# Patient Record
Sex: Female | Born: 1990 | Race: Black or African American | Hispanic: No | Marital: Single | Smoking: Never smoker
Health system: Southern US, Community
[De-identification: ages and names within clinical notes are randomized; demographics above are authoritative.]

## PROBLEM LIST (undated history)

## (undated) DIAGNOSIS — R7303 Prediabetes: Secondary | ICD-10-CM

## (undated) DIAGNOSIS — E282 Polycystic ovarian syndrome: Secondary | ICD-10-CM

---

## 2019-12-30 ENCOUNTER — Emergency Department (HOSPITAL_COMMUNITY)
Admission: EM | Admit: 2019-12-30 | Discharge: 2019-12-30 | Disposition: A | Discharge status: Home / Self Care | Payer: Self-pay | Attending: Emergency Medicine | Admitting: Emergency Medicine

## 2019-12-30 ENCOUNTER — Emergency Department (HOSPITAL_COMMUNITY): Payer: Self-pay

## 2019-12-30 ENCOUNTER — Other Ambulatory Visit: Payer: Self-pay

## 2019-12-30 ENCOUNTER — Encounter (HOSPITAL_COMMUNITY): Payer: Self-pay

## 2019-12-30 DIAGNOSIS — R030 Elevated blood-pressure reading, without diagnosis of hypertension: Secondary | ICD-10-CM | POA: Insufficient documentation

## 2019-12-30 DIAGNOSIS — Y999 Unspecified external cause status: Secondary | ICD-10-CM | POA: Insufficient documentation

## 2019-12-30 DIAGNOSIS — Y929 Unspecified place or not applicable: Secondary | ICD-10-CM | POA: Insufficient documentation

## 2019-12-30 DIAGNOSIS — X509XXA Other and unspecified overexertion or strenuous movements or postures, initial encounter: Secondary | ICD-10-CM | POA: Insufficient documentation

## 2019-12-30 DIAGNOSIS — S29011A Strain of muscle and tendon of front wall of thorax, initial encounter: Secondary | ICD-10-CM | POA: Insufficient documentation

## 2019-12-30 DIAGNOSIS — Y939 Activity, unspecified: Secondary | ICD-10-CM | POA: Insufficient documentation

## 2019-12-30 LAB — BASIC METABOLIC PANEL
Anion gap: 9 (ref 5–15)
BUN: 11 mg/dL (ref 6–20)
CO2: 29 mmol/L (ref 22–32)
Calcium: 9.3 mg/dL (ref 8.9–10.3)
Chloride: 101 mmol/L (ref 98–111)
Creatinine, Ser: 0.62 mg/dL (ref 0.44–1.00)
GFR calc Af Amer: >60 mL/min (ref >60)
GFR calc non Af Amer: >60 mL/min (ref >60)
Glucose, Bld: 130 mg/dL — ABNORMAL HIGH (ref 70–99)
Potassium: 4 mmol/L (ref 3.5–5.1)
Sodium: 139 mmol/L (ref 135–145)

## 2019-12-30 LAB — CBC
HCT: 46 % (ref 36.0–46.0)
Hemoglobin: 14.2 g/dL (ref 12.0–15.0)
MCH: 26 pg (ref 26.0–34.0)
MCHC: 30.9 g/dL (ref 30.0–36.0)
MCV: 84.2 fL (ref 80.0–100.0)
Platelets: 360 10*3/uL (ref 150–400)
RBC: 5.46 MIL/uL — ABNORMAL HIGH (ref 3.87–5.11)
RDW: 14.7 % (ref 11.5–15.5)
WBC: 7.7 10*3/uL (ref 4.0–10.5)
nRBC: 0 % (ref 0.0–0.2)

## 2019-12-30 LAB — TROPONIN I (HIGH SENSITIVITY): Troponin I (High Sensitivity): <2 ng/L (ref <18)

## 2019-12-30 LAB — I-STAT BETA HCG BLOOD, ED (MC, WL, AP ONLY): I-stat hCG, quantitative: <5 m[IU]/mL (ref <5)

## 2019-12-30 MED ORDER — ACETAMINOPHEN 325 MG PO TABS
650.0000 mg | ORAL_TABLET | Freq: Once | ORAL | Status: AC
  Administered 2019-12-30: 11:00:00 650 mg via ORAL
  Filled 2019-12-30: qty 2

## 2019-12-30 MED ORDER — SODIUM CHLORIDE 0.9% FLUSH: 3.0000 mL | Freq: Once | INTRAVENOUS | Status: DC

## 2019-12-30 NOTE — ED Triage Notes (Addendum)
Patient states she was having chest pain this AM at 0600 and when she picked her kids up to hold the pain was worse and had pain again when she twisted and turned.  Patient added during triage that she is having left chest pain when she takes a deep breath.

## 2019-12-30 NOTE — ED Provider Notes (Signed)
Kelsey Lucas   CSN: [id number] Arrival date & time: 12/30/19  0931     History Chief Complaint  Patient presents with  . Chest Pain    Kelsey Lucas is a 29 y.o. female.  The history is provided by the patient and medical records. No language interpreter was used.  Chest Pain  Kelsey Lucas is a 29 y.o. female who presents to the Emergency Department complaining of chest pain. She presents to the emergency department complaining of left sided chest pain that began this morning. She works at a daycare and noticed that she has pain on bending, twisting and lifting. She also has pain with deep breaths. She denies any fevers, cough, shortness of breath, leg swelling or pain, nausea, vomiting, abdominal pain. She has no significant past medical history and takes no medications. Symptoms are mild to moderate in nature.    History reviewed. No pertinent past medical history.  There are no problems to display for this patient.   History reviewed. No pertinent surgical history.   OB History   No obstetric history on file.     Family History  Problem Relation Age of Onset  . Hypertension Mother   . Diabetes Mother   . Cancer Father     Social History   Tobacco Use  . Smoking status: Never Smoker  . Smokeless tobacco: Never Used  Substance Use Topics  . Alcohol use: Never  . Drug use: Never    Home Medications Prior to Admission medications   Not on File    Allergies    Ibuprofen  Review of Systems   Review of Systems  Cardiovascular: Positive for chest pain.  All other systems reviewed and are negative.   Physical Exam Updated Vital Signs BP (!) 154/102 (BP Location: Left Arm)   Pulse 89   Temp 98.2 F (36.8 C) (Oral)   Resp 16   Ht 5\' 4"  (1.626 m)   Wt (!) 147.4 kg   SpO2 100%   BMI 55.79 kg/m   Physical Exam Vitals and nursing Lucas reviewed.  Constitutional:      Appearance: She is  well-developed.  HENT:     Head: Normocephalic and atraumatic.  Cardiovascular:     Rate and Rhythm: Normal rate and regular rhythm.     Heart sounds: No murmur.  Pulmonary:     Effort: Pulmonary effort is normal. No respiratory distress.     Breath sounds: Normal breath sounds.     Comments: There is no chest wall tenderness to palpation but pain is reproducible on resistance testing of the left upper extremity. Chest:     Chest wall: No tenderness.  Abdominal:     Palpations: Abdomen is soft.     Tenderness: There is no abdominal tenderness. There is no guarding or rebound.  Musculoskeletal:        General: No tenderness.     Comments: 2+ radial pulses bilaterally  Skin:    General: Skin is warm and dry.  Neurological:     Mental Status: She is alert and oriented to person, place, and time.  Psychiatric:        Behavior: Behavior normal.     ED Results / Procedures / Treatments   Labs (all labs ordered are listed, but only abnormal results are displayed) Labs Reviewed  BASIC METABOLIC PANEL - Abnormal; Notable for the following components:      Result Value   Glucose, Bld 130 (*)  All other components within normal limits  CBC - Abnormal; Notable for the following components:   RBC 5.46 (*)    All other components within normal limits  I-STAT BETA HCG BLOOD, ED ([facility], [facility], [facility] ONLY)  TROPONIN I (HIGH SENSITIVITY)  TROPONIN I (HIGH SENSITIVITY)    EKG EKG Interpretation  Date/Time:  Monday December 30 2019 09:42:31 EST Ventricular Rate:  95 PR Interval:    QRS Duration: 77 QT Interval:  334 QTC Calculation: 420 R Axis:   61 Text Interpretation: Sinus rhythm no prior available for comparison Confirmed by Tilden Fossa [phone]) on 12/30/2019 9:49:33 AM Also confirmed by Tilden Fossa [phone]), editor Elita Quick [phone])  on 12/30/2019 10:36:46 AM   Radiology DG Chest 2 View  Result Date: 12/30/2019 CLINICAL DATA:  Chest pain EXAM: CHEST - 2 VIEW  COMPARISON:  None. FINDINGS: Normal heart size and mediastinal contours. No acute infiltrate or edema. No effusion or pneumothorax. No acute osseous findings. IMPRESSION: Negative chest. Electronically Signed   By: Marnee Spring M.D.   On: 12/30/2019 10:59    Procedures Procedures (including critical care time)  Medications Ordered in ED Medications  acetaminophen (TYLENOL) tablet 650 mg (has no administration in time range)    ED Course  I have reviewed the triage vital signs and the nursing notes.  Pertinent labs & imaging results that were available during my care of the patient were reviewed by me and considered in my medical decision making (see chart for details).    MDM Rules/Calculators/A&P                     Patient here for evaluation of left-sided chest pain, reproducible on examination. Presentation is not consistent with ACS, PE, dissection, pneumonia. Discussed with patient home care for chest wall strain. Discussed outpatient follow-up and return precautions.  Kelsey Lucas was evaluated in Emergency Department on 12/30/2019 for the symptoms described in the history of present illness. She was evaluated in the context of the global COVID-19 pandemic, which necessitated consideration that the patient might be at risk for infection with the SARS-CoV-2 virus that causes COVID-19. Institutional protocols and algorithms that pertain to the evaluation of patients at risk for COVID-19 are in a state of rapid change based on information released by regulatory bodies including the CDC and federal and state organizations. These policies and algorithms were followed during the patient's care in the ED.  Final Clinical Impression(s) / ED Diagnoses Final diagnoses:  None    Rx / DC Orders ED Discharge Orders    None       Tilden Fossa, MD 12/30/19 1122

## 2019-12-30 NOTE — ED Notes (Signed)
Repeat trop to be redrawn at [facility]

## 2019-12-30 NOTE — Discharge Instructions (Signed)
You can take tylenol as well as lidoderm cream or voltaren gel, available over the counter according to label instructions.    Your blood pressure was elevated today. Please follow-up with your family doctor for recheck.

## 2020-03-23 ENCOUNTER — Encounter (HOSPITAL_COMMUNITY): Payer: Self-pay | Admitting: Emergency Medicine

## 2020-03-23 ENCOUNTER — Emergency Department (HOSPITAL_COMMUNITY)
Admission: EM | Admit: 2020-03-23 | Discharge: 2020-03-23 | Disposition: A | Discharge status: Home / Self Care | Payer: Self-pay | Attending: Emergency Medicine | Admitting: Emergency Medicine

## 2020-03-23 ENCOUNTER — Other Ambulatory Visit: Payer: Self-pay

## 2020-03-23 DIAGNOSIS — R1033 Periumbilical pain: Secondary | ICD-10-CM | POA: Insufficient documentation

## 2020-03-23 DIAGNOSIS — R197 Diarrhea, unspecified: Secondary | ICD-10-CM | POA: Insufficient documentation

## 2020-03-23 LAB — CBC WITH DIFFERENTIAL/PLATELET
Abs Immature Granulocytes: 0.02 10*3/uL (ref 0.00–0.07)
Basophils Absolute: 0 10*3/uL (ref 0.0–0.1)
Basophils Relative: 1 %
Eosinophils Absolute: 0.1 10*3/uL (ref 0.0–0.5)
Eosinophils Relative: 1 %
HCT: 46.9 % — ABNORMAL HIGH (ref 36.0–46.0)
Hemoglobin: 14.5 g/dL (ref 12.0–15.0)
Immature Granulocytes: 0 %
Lymphocytes Relative: 29 %
Lymphs Abs: 1.5 10*3/uL (ref 0.7–4.0)
MCH: 26 pg (ref 26.0–34.0)
MCHC: 30.9 g/dL (ref 30.0–36.0)
MCV: 84.1 fL (ref 80.0–100.0)
Monocytes Absolute: 0.7 10*3/uL (ref 0.1–1.0)
Monocytes Relative: 13 %
Neutro Abs: 2.9 10*3/uL (ref 1.7–7.7)
Neutrophils Relative %: 56 %
Platelets: 349 10*3/uL (ref 150–400)
RBC: 5.58 MIL/uL — ABNORMAL HIGH (ref 3.87–5.11)
RDW: 14.4 % (ref 11.5–15.5)
WBC: 5.2 10*3/uL (ref 4.0–10.5)
nRBC: 0 % (ref 0.0–0.2)

## 2020-03-23 LAB — COMPREHENSIVE METABOLIC PANEL
ALT: 40 U/L (ref 0–44)
AST: 28 U/L (ref 15–41)
Albumin: 3.9 g/dL (ref 3.5–5.0)
Alkaline Phosphatase: 92 U/L (ref 38–126)
Anion gap: 10 (ref 5–15)
BUN: 11 mg/dL (ref 6–20)
CO2: 26 mmol/L (ref 22–32)
Calcium: 8.5 mg/dL — ABNORMAL LOW (ref 8.9–10.3)
Chloride: 102 mmol/L (ref 98–111)
Creatinine, Ser: 0.71 mg/dL (ref 0.44–1.00)
GFR calc Af Amer: >60 mL/min (ref >60)
GFR calc non Af Amer: >60 mL/min (ref >60)
Glucose, Bld: 119 mg/dL — ABNORMAL HIGH (ref 70–99)
Potassium: 4 mmol/L (ref 3.5–5.1)
Sodium: 138 mmol/L (ref 135–145)
Total Bilirubin: 0.9 mg/dL (ref 0.3–1.2)
Total Protein: 8.2 g/dL — ABNORMAL HIGH (ref 6.5–8.1)

## 2020-03-23 LAB — URINALYSIS, ROUTINE W REFLEX MICROSCOPIC
Bilirubin Urine: NEGATIVE
Glucose, UA: NEGATIVE mg/dL
Hgb urine dipstick: NEGATIVE
Ketones, ur: NEGATIVE mg/dL
Nitrite: NEGATIVE
Protein, ur: 30 mg/dL — AB
Specific Gravity, Urine: 1.027 (ref 1.005–1.030)
pH: 5 (ref 5.0–8.0)

## 2020-03-23 LAB — I-STAT BETA HCG BLOOD, ED (MC, WL, AP ONLY): I-stat hCG, quantitative: <5 m[IU]/mL (ref <5)

## 2020-03-23 LAB — LIPASE, BLOOD: Lipase: 26 U/L (ref 11–51)

## 2020-03-23 MED ORDER — ONDANSETRON 4 MG PO TBDP: ORAL_TABLET | ORAL | 0 refills | Status: AC

## 2020-03-23 MED ORDER — FAMOTIDINE 20 MG PO TABS: 20.0000 mg | ORAL_TABLET | Freq: Two times a day (BID) | ORAL | 0 refills | Status: DC

## 2020-03-23 MED ORDER — SODIUM CHLORIDE 0.9 % IV BOLUS
1000.0000 mL | Freq: Once | INTRAVENOUS | Status: AC
  Administered 2020-03-23: 1000 mL via INTRAVENOUS

## 2020-03-23 MED ORDER — DICYCLOMINE HCL 10 MG/ML IM SOLN
20.0000 mg | Freq: Once | INTRAMUSCULAR | Status: AC
  Administered 2020-03-23: 20 mg via INTRAMUSCULAR
  Filled 2020-03-23: qty 2

## 2020-03-23 MED ORDER — DICYCLOMINE HCL 20 MG PO TABS: 20.0000 mg | ORAL_TABLET | Freq: Two times a day (BID) | ORAL | 0 refills | Status: DC

## 2020-03-23 MED ORDER — FAMOTIDINE IN NACL 20-0.9 MG/50ML-% IV SOLN
20.0000 mg | Freq: Once | INTRAVENOUS | Status: AC
  Administered 2020-03-23: 20 mg via INTRAVENOUS
  Filled 2020-03-23: qty 50

## 2020-03-23 MED ORDER — ONDANSETRON HCL 4 MG/2ML IJ SOLN
4.0000 mg | Freq: Once | INTRAMUSCULAR | Status: AC
  Administered 2020-03-23: 12:00:00 4 mg via INTRAVENOUS
  Filled 2020-03-23: qty 2

## 2020-03-23 NOTE — ED Triage Notes (Signed)
Pt c/o mid abd pains that is constant since yesterday. Reports when she eats or drinks anything has diarrhea. Denies n/v.

## 2020-03-23 NOTE — ED Notes (Signed)
ED Provider at bedside. 

## 2020-03-23 NOTE — ED Provider Notes (Signed)
[facility]-EMERGENCY DEPT Provider Note   CSN: [id number] Arrival date & time: 03/23/20  1105     History Chief Complaint  Patient presents with  . Abdominal Pain  . Diarrhea    Kelsey Lucas is a 29 y.o. female.  Kelsey Lucas is a 29 y.o. female with history of obesity, otherwise healthy, who presents to the ED for evaluation of periumbilical abdominal pains associated with diarrhea.  Patient states that ever since yesterday afternoon whenever she eats or drinks something she has diarrhea shortly after.  She has not noticed any blood in her stool.  She reports an associated cramping abdominal pain in the middle of her abdomen that is constant.  She denies any fevers or chills.  No nausea or vomiting.  No dysuria or urinary frequency.  Did not eat anything out of the ordinary yesterday.  No history of abdominal surgeries.  Has not taken anything to treat her symptoms prior to arrival.  No other aggravating or alleviating factors.        History reviewed. No pertinent past medical history.  There are no problems to display for this patient.   History reviewed. No pertinent surgical history.   OB History   No obstetric history on file.     Family History  Problem Relation Age of Onset  . Hypertension Mother   . Diabetes Mother   . Cancer Father     Social History   Tobacco Use  . Smoking status: Never Smoker  . Smokeless tobacco: Never Used  Substance Use Topics  . Alcohol use: Never  . Drug use: Never    Home Medications Prior to Admission medications   Not on File    Allergies    Ibuprofen  Review of Systems   Review of Systems  Constitutional: Negative for chills and fever.  HENT: Negative.   Respiratory: Negative for cough and shortness of breath.   Cardiovascular: Negative for chest pain.  Gastrointestinal: Positive for abdominal pain and diarrhea. Negative for blood in stool, constipation, nausea and vomiting.    Genitourinary: Negative for dysuria and frequency.  Musculoskeletal: Negative for arthralgias and myalgias.  Skin: Negative for color change and rash.  Neurological: Negative for dizziness, syncope and light-headedness.    Physical Exam Updated Vital Signs BP (!) 152/119 (BP Location: Left Arm)   Pulse (!) 102   Temp 97.9 F (36.6 C) (Oral)   Resp 18   SpO2 99%   Physical Exam Vitals and nursing note reviewed.  Constitutional:      General: She is not in acute distress.    Appearance: She is well-developed. She is obese. She is not ill-appearing or diaphoretic.  HENT:     Head: Normocephalic and atraumatic.  Eyes:     General:        Right eye: No discharge.        Left eye: No discharge.     Pupils: Pupils are equal, round, and reactive to light.  Cardiovascular:     Rate and Rhythm: Normal rate and regular rhythm.     Heart sounds: Normal heart sounds. No murmur. No friction rub. No gallop.   Pulmonary:     Effort: Pulmonary effort is normal. No respiratory distress.     Breath sounds: Normal breath sounds. No wheezing or rales.     Comments: Respirations equal and unlabored, patient able to speak in full sentences, lungs clear to auscultation bilaterally Abdominal:     General: Bowel sounds are  normal. There is no distension.     Palpations: Abdomen is soft. There is no mass.     Tenderness: There is abdominal tenderness. There is no guarding.     Comments: Abdomen is soft, nondistended, bowel sounds present throughout, patient has some periumbilical abdominal tenderness on exam without guarding or peritoneal signs, all other quadrants nontender to palpation  Musculoskeletal:        General: No deformity.     Cervical back: Neck supple.  Skin:    General: Skin is warm and dry.     Capillary Refill: Capillary refill takes less than 2 seconds.  Neurological:     Mental Status: She is alert.     Coordination: Coordination normal.     Comments: Speech is clear, able  to follow commands Moves extremities without ataxia, coordination intact  Psychiatric:        Mood and Affect: Mood normal.        Behavior: Behavior normal.     ED Results / Procedures / Treatments   Labs (all labs ordered are listed, but only abnormal results are displayed) Labs Reviewed  COMPREHENSIVE METABOLIC PANEL - Abnormal; Notable for the following components:      Result Value   Glucose, Bld 119 (*)    Calcium 8.5 (*)    Total Protein 8.2 (*)    All other components within normal limits  CBC WITH DIFFERENTIAL/PLATELET - Abnormal; Notable for the following components:   RBC 5.58 (*)    HCT 46.9 (*)    All other components within normal limits  URINALYSIS, ROUTINE W REFLEX MICROSCOPIC - Abnormal; Notable for the following components:   APPearance CLOUDY (*)    Protein, ur 30 (*)    Leukocytes,Ua MODERATE (*)    Bacteria, UA FEW (*)    All other components within normal limits  LIPASE, BLOOD  I-STAT BETA HCG BLOOD, ED ([facility], [facility], [facility] ONLY)    EKG None  Radiology No results found.  Procedures Procedures (including critical care time)  Medications Ordered in ED Medications  sodium chloride 0.9 % bolus 1,000 mL (1,000 mLs Intravenous New Bag/Given (Non-Interop) 03/23/20 1209)  ondansetron (ZOFRAN) injection 4 mg (4 mg Intravenous Given 03/23/20 1210)  dicyclomine (BENTYL) injection 20 mg (20 mg Intramuscular Given 03/23/20 1210)  famotidine (PEPCID) IVPB 20 mg premix (20 mg Intravenous New Bag/Given (Non-Interop) 03/23/20 1210)    ED Course  I have reviewed the triage vital signs and the nursing notes.  Pertinent labs & imaging results that were available during my care of the patient were reviewed by me and considered in my medical decision making (see chart for details).    MDM Rules/Calculators/A&P                     Patient presents to the ED with complaints of abdominal pain. Patient nontoxic appearing, in no apparent distress, mild tachycardia but vitals  otherwise normal. On exam patient tender to palpation in the periumbilical region, no peritoneal signs. Will evaluate with labs. Analgesics, anti-emetics, and fluids administered.  Mild pain associated with diarrhea, suspect mild enteritis or colitis, given reassuring exam do not feel imaging is indicated at this time.  ER work-up reviewed:  CBC: No leukocytosis, normal hemoglobin CMP: Glucose of 119 no other significant electrolyte derangements, normal renal and liver function Lipase: WNL UA: Moderate leukocytes with few bacteria and white cells, but 6-10 squamous cells also noted, I suspect contamination, as patient is not having any urinary  symptoms Preg test: Negative  On repeat abdominal exam patient remains without peritoneal signs, doubt cholecystitis, pancreatitis, diverticulitis, appendicitis, bowel obstruction/perforation, PID, or ectopic pregnancy. Patient tolerating PO in the emergency department. Will discharge home with supportive measures. I discussed results, treatment plan, need for PCP follow-up, and return precautions with the patient. Provided opportunity for questions, patient confirmed understanding and is in agreement with plan.   BP (!) 144/104   Pulse 88   Temp 97.9 F (36.6 C) (Oral)   Resp 16   SpO2 98%    Final Clinical Impression(s) / ED Diagnoses Final diagnoses:  Periumbilical abdominal pain  Diarrhea, unspecified type    Rx / DC Orders ED Discharge Orders         Ordered    dicyclomine (BENTYL) 20 MG tablet  2 times daily     03/23/20 1314    ondansetron (ZOFRAN ODT) 4 MG disintegrating tablet     03/23/20 1314    famotidine (PEPCID) 20 MG tablet  2 times daily     03/23/20 1317           Benedetto Goad [place], [place] 03/23/20 1327    Sherwood Gambler, MD 03/24/20 [phone]

## 2020-03-23 NOTE — Discharge Instructions (Signed)
Your work-up today is reassuring, I suspect this may be related to food or viral syndrome that is caused some inflammation in your colon or intestines.  Take Bentyl to help with cramping pain, you can use Zofran as needed for any nausea or vomiting, Pepcid to help with any stomach irritation.  To help relieve diarrhea you can also take a daily probiotic (brands like align or Culturelle are good) these can be purchased over-the-counter.  You should also eat a bland diet for the next few days, read information provided on this.  Follow-up with your primary care doctor, return to the ED if you have worsening abdominal pain or pain that moves to a new location.  Worsening diarrhea or blood in your stool, vomiting, fevers or any other new or concerning symptoms.

## 2020-04-06 ENCOUNTER — Emergency Department (HOSPITAL_COMMUNITY): Payer: No Typology Code available for payment source

## 2020-04-06 ENCOUNTER — Other Ambulatory Visit: Payer: Self-pay

## 2020-04-06 ENCOUNTER — Emergency Department (HOSPITAL_COMMUNITY)
Admission: EM | Admit: 2020-04-06 | Discharge: 2020-04-06 | Disposition: A | Discharge status: Home / Self Care | Payer: No Typology Code available for payment source | Attending: Emergency Medicine | Admitting: Emergency Medicine

## 2020-04-06 ENCOUNTER — Encounter (HOSPITAL_COMMUNITY): Payer: Self-pay

## 2020-04-06 DIAGNOSIS — Z79899 Other long term (current) drug therapy: Secondary | ICD-10-CM | POA: Diagnosis not present

## 2020-04-06 DIAGNOSIS — R112 Nausea with vomiting, unspecified: Secondary | ICD-10-CM

## 2020-04-06 DIAGNOSIS — R1011 Right upper quadrant pain: Secondary | ICD-10-CM

## 2020-04-06 DIAGNOSIS — K8020 Calculus of gallbladder without cholecystitis without obstruction: Secondary | ICD-10-CM | POA: Diagnosis not present

## 2020-04-06 DIAGNOSIS — R109 Unspecified abdominal pain: Secondary | ICD-10-CM | POA: Diagnosis present

## 2020-04-06 LAB — CBC
HCT: 46.6 % — ABNORMAL HIGH (ref 36.0–46.0)
Hemoglobin: 14.6 g/dL (ref 12.0–15.0)
MCH: 26.3 pg (ref 26.0–34.0)
MCHC: 31.3 g/dL (ref 30.0–36.0)
MCV: 83.8 fL (ref 80.0–100.0)
Platelets: 397 10*3/uL (ref 150–400)
RBC: 5.56 MIL/uL — ABNORMAL HIGH (ref 3.87–5.11)
RDW: 14.6 % (ref 11.5–15.5)
WBC: 8.6 10*3/uL (ref 4.0–10.5)
nRBC: 0 % (ref 0.0–0.2)

## 2020-04-06 LAB — BASIC METABOLIC PANEL
Anion gap: 11 (ref 5–15)
BUN: 14 mg/dL (ref 6–20)
CO2: 27 mmol/L (ref 22–32)
Calcium: 9.6 mg/dL (ref 8.9–10.3)
Chloride: 101 mmol/L (ref 98–111)
Creatinine, Ser: 0.72 mg/dL (ref 0.44–1.00)
GFR calc Af Amer: >60 mL/min (ref >60)
GFR calc non Af Amer: >60 mL/min (ref >60)
Glucose, Bld: 159 mg/dL — ABNORMAL HIGH (ref 70–99)
Potassium: 3.7 mmol/L (ref 3.5–5.1)
Sodium: 139 mmol/L (ref 135–145)

## 2020-04-06 LAB — URINALYSIS, ROUTINE W REFLEX MICROSCOPIC
Bilirubin Urine: NEGATIVE
Glucose, UA: NEGATIVE mg/dL
Ketones, ur: NEGATIVE mg/dL
Nitrite: NEGATIVE
Protein, ur: NEGATIVE mg/dL
RBC / HPF: >50 RBC/hpf — ABNORMAL HIGH (ref 0–5)
Specific Gravity, Urine: 1.012 (ref 1.005–1.030)
pH: 5 (ref 5.0–8.0)

## 2020-04-06 LAB — HEPATIC FUNCTION PANEL
ALT: 50 U/L — ABNORMAL HIGH (ref 0–44)
AST: 30 U/L (ref 15–41)
Albumin: 4.2 g/dL (ref 3.5–5.0)
Alkaline Phosphatase: 86 U/L (ref 38–126)
Bilirubin, Direct: 0.1 mg/dL (ref 0.0–0.2)
Indirect Bilirubin: 0.6 mg/dL (ref 0.3–0.9)
Total Bilirubin: 0.7 mg/dL (ref 0.3–1.2)
Total Protein: 8.5 g/dL — ABNORMAL HIGH (ref 6.5–8.1)

## 2020-04-06 LAB — LIPASE, BLOOD: Lipase: 30 U/L (ref 11–51)

## 2020-04-06 LAB — I-STAT BETA HCG BLOOD, ED (MC, WL, AP ONLY): I-stat hCG, quantitative: <5 m[IU]/mL (ref <5)

## 2020-04-06 MED ORDER — ONDANSETRON 4 MG PO TBDP
4.0000 mg | ORAL_TABLET | Freq: Once | ORAL | Status: AC
  Administered 2020-04-06: 05:00:00 4 mg via ORAL
  Filled 2020-04-06: qty 1

## 2020-04-06 MED ORDER — MORPHINE SULFATE (PF) 4 MG/ML IV SOLN
4.0000 mg | Freq: Once | INTRAVENOUS | Status: AC
  Administered 2020-04-06: 4 mg via INTRAVENOUS
  Filled 2020-04-06: qty 1

## 2020-04-06 MED ORDER — ONDANSETRON HCL 4 MG/2ML IJ SOLN
4.0000 mg | Freq: Once | INTRAMUSCULAR | Status: AC
  Administered 2020-04-06: 4 mg via INTRAVENOUS
  Filled 2020-04-06: qty 2

## 2020-04-06 MED ORDER — ONDANSETRON 4 MG PO TBDP: 4.0000 mg | ORAL_TABLET | Freq: Three times a day (TID) | ORAL | 0 refills | Status: DC | PRN

## 2020-04-06 MED ORDER — HYDROCODONE-ACETAMINOPHEN 5-325 MG PO TABS: 1.0000 | ORAL_TABLET | Freq: Four times a day (QID) | ORAL | 0 refills | Status: DC | PRN

## 2020-04-06 NOTE — ED Provider Notes (Signed)
Care assumed from [facility], [place], at shift change, please see their notes for full documentation of patient's complaint/HPI. Briefly, pt here with complaint of right sided flank pain with nausea. Results so far show no leukocytosis and no electrolyte abnormalities. Awaiting CT Renal stone study and U/A. Plan is to reassess and dispo accordingly. Pt's pain begin in the mid right abdomen and wrapped around to her back; no fevers or chills. Low suspicion for appendicitis however will need reassessment if no findings on CT scan.   Physical Exam  BP (!) 133/98 (BP Location: Right Arm)   Pulse 85   Temp 98.2 F (36.8 C) (Oral)   Resp 16   Ht 5\' 4"  (1.626 m)   Wt (!) 145.2 kg   SpO2 99%   BMI 54.93 kg/m   Physical Exam  ED Course/Procedures     Procedures  Results for orders placed or performed during the hospital encounter of 04/06/20  Urinalysis, Routine w reflex microscopic- may I&O cath if menses  Result Value Ref Range   Color, Urine YELLOW YELLOW   APPearance HAZY (A) CLEAR   Specific Gravity, Urine 1.012 1.005 - 1.030   pH 5.0 5.0 - 8.0   Glucose, UA NEGATIVE NEGATIVE mg/dL   Hgb urine dipstick LARGE (A) NEGATIVE   Bilirubin Urine NEGATIVE NEGATIVE   Ketones, ur NEGATIVE NEGATIVE mg/dL   Protein, ur NEGATIVE NEGATIVE mg/dL   Nitrite NEGATIVE NEGATIVE   Leukocytes,Ua TRACE (A) NEGATIVE   RBC / HPF >50 (H) 0 - 5 RBC/hpf   WBC, UA 6-10 0 - 5 WBC/hpf   Bacteria, UA FEW (A) NONE SEEN   Squamous Epithelial / LPF 0-5 0 - 5   Mucus PRESENT   CBC  Result Value Ref Range   WBC 8.6 4.0 - 10.5 K/uL   RBC 5.56 (H) 3.87 - 5.11 MIL/uL   Hemoglobin 14.6 12.0 - 15.0 g/dL   HCT 46.6 (H) 36.0 - 46.0 %   MCV 83.8 80.0 - 100.0 fL   MCH 26.3 26.0 - 34.0 pg   MCHC 31.3 30.0 - 36.0 g/dL   RDW 14.6 11.5 - 15.5 %   Platelets 397 150 - 400 K/uL   nRBC 0.0 0.0 - 0.2 %  Basic metabolic panel  Result Value Ref Range   Sodium 139 135 - 145 mmol/L   Potassium 3.7 3.5 - 5.1 mmol/L   Chloride 101 98 - 111 mmol/L   CO2 27 22 - 32 mmol/L   Glucose, Bld 159 (H) 70 - 99 mg/dL   BUN 14 6 - 20 mg/dL   Creatinine, Ser 0.72 0.44 - 1.00 mg/dL   Calcium 9.6 8.9 - 10.3 mg/dL   GFR calc non Af Amer >60 >60 mL/min   GFR calc Af Amer >60 >60 mL/min   Anion gap 11 5 - 15  Lipase, blood  Result Value Ref Range   Lipase 30 11 - 51 U/L  Hepatic function panel  Result Value Ref Range   Total Protein 8.5 (H) 6.5 - 8.1 g/dL   Albumin 4.2 3.5 - 5.0 g/dL   AST 30 15 - 41 U/L   ALT 50 (H) 0 - 44 U/L   Alkaline Phosphatase 86 38 - 126 U/L   Total Bilirubin 0.7 0.3 - 1.2 mg/dL   Bilirubin, Direct 0.1 0.0 - 0.2 mg/dL   Indirect Bilirubin 0.6 0.3 - 0.9 mg/dL  I-Stat beta hCG blood, ED  Result Value Ref Range   I-stat hCG, quantitative <5.0 <  5 mIU/mL   Comment 3           CT Renal Stone Study IMPRESSION:  1. No urinary calculus or obstructive uropathy. Normal appendix and  no acute or inflammatory process identified in the non-contrast  abdomen or pelvis.  2. Possible a patent steatosis.   RUQ Ultrasound IMPRESSION:  1. Cholelithiasis without evidence of acute cholecystitis.  2. Diffuse hepatic steatosis with areas of sparing adjacent to the  gallbladder fossa.    MDM  U/A with trace leuks, > 50 RBCs per HPF, 6-10 WBCs per HPF, and few bacteria. Pt without any urinary symptoms at this time. Question blood due to kidney stone vs menses? Pt reports hx of irregular cycles and unable to determine when she is going to have a period. HCG negative today.   CT scan without obvious findings at this time. On reeval pt's pain appears to be periumbilical radiating to right side; no obvious RLQ abdominal pain. Negative McBurney's point. Will add on LFTs and Lipase at this time; question gallbladder etiology as pt was in the ED 2 weeks ago for similar presentation. RUQ added as well.   LFTs within normal limits.  Lipase 30.   RUQ ultrasound with findings consistent with cholelithiasis  without acute cholecystitis. Suspect this is the cause of pt's symptoms today. Will have her follow up outpatient with surgery. Have discussed diet changes. Will discharge home with zofran and very short course of Norco PRN for pain. Pt instructed to return to the ED immediately for any worsening pain, uncontrollable pain with pain medicine at home, intractible vomiting, fevers > 100.4. Pt is in agreement with plan. Her pain has been controlled in the ED today. She has been able to tolerate water without difficulty. Stable for discharge home at this time.   This note was prepared using Dragon voice recognition software and may include unintentional dictation errors due to the inherent limitations of voice recognition software.  Calculus of gallbladder without cholecystitis without obstruction  Abdominal pain, unspecified abdominal location  Non-intractable vomiting with nausea, unspecified vomiting type  RUQ abdominal pain - Plan: US Abdomen Limited RUQ, US Abdomen Limited RUQ     Discharge Instructions     Your ultrasound showed gallstones today which is likely the cause of your symptoms. See attached resource guide for further information.   Follow up with [facility] to discuss treatment options including elective removal of your gallbladder  I have sent prescriptions for nausea and pain as needed. I would recommend avoiding fatty/spicy/greasy foods as this can irritate your gallbladder. If your pain returns you should take 600-800 mg Ibuprofen first and if you continue to have breakthrough pain then take the narcotic pain medication.   Return to the ED IMMEDIATELY for any worsening symptoms including worsening pain, pain that is uncontrolled with medications at home, excessive vomiting uncontrolled at home, fevers > 100.4.         Tanda Rockers, PA-C 04/06/20 1145    Jacalyn Lefevre, MD 04/06/20 1428

## 2020-04-06 NOTE — ED Provider Notes (Signed)
[facility] DEPT Provider Note   CSN: [id number] Arrival date & time: 04/06/20  0457     History Chief Complaint  Patient presents with  . Flank Pain    Kelsey Lucas is a 29 y.o. female.  Patient to ED with sudden onset of right lateral abdominal and flank pain that woke her from sleep around 4:00 am. She reports nausea with vomiting. No fever. She denies urinary symptoms, vaginal discharge or irregular menses. No diarrhea. She reports being seen 2 weeks ago for periumbilical abdominal pain that had completely resolved. Current symptoms are different location. No CP, SOB, cough. No history of kidney stones.   The history is provided by the patient. No language interpreter was used.  Flank Pain Associated symptoms include abdominal pain.       History reviewed. No pertinent past medical history.  There are no problems to display for this patient.   History reviewed. No pertinent surgical history.   OB History   No obstetric history on file.     Family History  Problem Relation Age of Onset  . Hypertension Mother   . Diabetes Mother   . Cancer Father     Social History   Tobacco Use  . Smoking status: Never Smoker  . Smokeless tobacco: Never Used  Substance Use Topics  . Alcohol use: Never  . Drug use: Never    Home Medications Prior to Admission medications   Medication Sig Start Date End Date Taking? Authorizing Provider  dicyclomine (BENTYL) 20 MG tablet Take 1 tablet (20 mg total) by mouth 2 (two) times daily. 03/23/20   Jacqlyn Larsen, PA-C  famotidine (PEPCID) 20 MG tablet Take 1 tablet (20 mg total) by mouth 2 (two) times daily. 03/23/20   Jacqlyn Larsen, PA-C  ondansetron (ZOFRAN ODT) 4 MG disintegrating tablet 4mg  ODT q4 hours prn nausea/vomit 03/23/20   Jacqlyn Larsen, PA-C    Allergies    Ibuprofen  Review of Systems   Review of Systems  Constitutional: Negative for chills and fever.  Gastrointestinal: Positive  for abdominal pain, nausea and vomiting. Negative for constipation and diarrhea.  Genitourinary: Positive for flank pain. Negative for dysuria, hematuria and vaginal discharge.  Skin: Negative.   Neurological: Negative.     Physical Exam Updated Vital Signs BP (!) 175/105 (BP Location: Left Arm)   Pulse (!) 112   Temp 98.2 F (36.8 C) (Oral)   Resp 18   Ht 5\' 4"  (1.626 m)   Wt (!) 145.2 kg   SpO2 100%   BMI 54.93 kg/m   Physical Exam Vitals and nursing note reviewed.  Constitutional:      Appearance: She is well-developed.  HENT:     Head: Normocephalic.  Cardiovascular:     Rate and Rhythm: Normal rate and regular rhythm.  Pulmonary:     Effort: Pulmonary effort is normal.     Breath sounds: Normal breath sounds. No wheezing, rhonchi or rales.  Abdominal:     General: Bowel sounds are normal.     Palpations: Abdomen is soft.     Tenderness: There is abdominal tenderness. There is no guarding or rebound.    Musculoskeletal:        General: Normal range of motion.     Cervical back: Normal range of motion and neck supple.  Skin:    General: Skin is warm and dry.     Findings: No rash.  Neurological:     Mental Status: She  is alert and oriented to person, place, and time.     ED Results / Procedures / Treatments   Labs (all labs ordered are listed, but only abnormal results are displayed) Labs Reviewed  CBC - Abnormal; Notable for the following components:      Result Value   RBC 5.56 (*)    HCT 46.6 (*)    All other components within normal limits  BASIC METABOLIC PANEL - Abnormal; Notable for the following components:   Glucose, Bld 159 (*)    All other components within normal limits  URINALYSIS, ROUTINE W REFLEX MICROSCOPIC  I-STAT BETA HCG BLOOD, ED ([facility], [facility], [facility] ONLY)   Results for orders placed or performed during the hospital encounter of 04/06/20  CBC  Result Value Ref Range   WBC 8.6 4.0 - 10.5 K/uL   RBC 5.56 (H) 3.87 - 5.11 MIL/uL    Hemoglobin 14.6 12.0 - 15.0 g/dL   HCT 65.0 (H) 35.4 - 65.6 %   MCV 83.8 80.0 - 100.0 fL   MCH 26.3 26.0 - 34.0 pg   MCHC 31.3 30.0 - 36.0 g/dL   RDW 81.2 75.1 - 70.0 %   Platelets 397 150 - 400 K/uL   nRBC 0.0 0.0 - 0.2 %  Basic metabolic panel  Result Value Ref Range   Sodium 139 135 - 145 mmol/L   Potassium 3.7 3.5 - 5.1 mmol/L   Chloride 101 98 - 111 mmol/L   CO2 27 22 - 32 mmol/L   Glucose, Bld 159 (H) 70 - 99 mg/dL   BUN 14 6 - 20 mg/dL   Creatinine, Ser 1.74 0.44 - 1.00 mg/dL   Calcium 9.6 8.9 - 94.4 mg/dL   GFR calc non Af Amer >60 >60 mL/min   GFR calc Af Amer >60 >60 mL/min   Anion gap 11 5 - 15  I-Stat beta hCG blood, ED  Result Value Ref Range   I-stat hCG, quantitative <5.0 <5 mIU/mL   Comment 3            EKG None  Radiology No results found.  Procedures Procedures (including critical care time)  Medications Ordered in ED Medications  ondansetron (ZOFRAN) injection 4 mg (has no administration in time range)  morphine 4 MG/ML injection 4 mg (has no administration in time range)  ondansetron (ZOFRAN-ODT) disintegrating tablet 4 mg (4 mg Oral Given 04/06/20 0519)    ED Course  I have reviewed the triage vital signs and the nursing notes.  Pertinent labs & imaging results that were available during my care of the patient were reviewed by me and considered in my medical decision making (see chart for details).    MDM Rules/Calculators/A&P                      Patient to ED with right sided abdominal to flank pain of sudden onset this morning associated with N, V.   She is nontoxic in appearance. She reports her pain is "8/10". Labs are so far unremarkable, no leukocytosis. UA pending with delay in urine collection.   Will obtain CT renal based on symptoms and presentation. If negative, she will need re-evaluation and serial abdominal exams, UA results and pain management. Consider appy given location.   Patient care signed out to [facility], PA-C,  pending further evaluation.   Final Clinical Impression(s) / ED Diagnoses Final diagnoses:  None   1. Abdominal pain 2. Flank pain, right 3. Nausea and vomiting  Rx / DC Orders ED Discharge Orders    None       Elpidio Anis, Cordelia Poche 04/06/20 0725    Glynn Octave, MD 04/06/20 [phone]

## 2020-04-06 NOTE — ED Notes (Signed)
Patient back from CT. Attempting to give urine sample.

## 2020-04-06 NOTE — ED Triage Notes (Signed)
Right sided flank pain suddenly started just before arriving.

## 2020-04-06 NOTE — ED Notes (Addendum)
Patient transported to CT 

## 2020-04-06 NOTE — Discharge Instructions (Signed)
Your ultrasound showed gallstones today which is likely the cause of your symptoms. See attached resource guide for further information.   Follow up with [facility] to discuss treatment options including elective removal of your gallbladder  I have sent prescriptions for nausea and pain as needed. I would recommend avoiding fatty/spicy/greasy foods as this can irritate your gallbladder. If your pain returns you should take 600-800 mg Ibuprofen first and if you continue to have breakthrough pain then take the narcotic pain medication.   Return to the ED IMMEDIATELY for any worsening symptoms including worsening pain, pain that is uncontrolled with medications at home, excessive vomiting uncontrolled at home, fevers > 100.4.

## 2020-04-27 ENCOUNTER — Ambulatory Visit: Payer: Self-pay | Admitting: Surgery

## 2020-04-27 NOTE — H&P (View-Only) (Signed)
Kelsey Lucas Appointment: 04/27/2020 1:50 PM Location: [facility] Patient #: [id number] DOB: [birth year] Single / Language: [place] / Race: Black or African American Female  History of Present Illness Kelsey Lucas A. Bassem Bernasconi MD; 04/27/2020 2:27 PM) Patient words: Patient sent to request of emergency room due to abdominal pain. She seen over 2 weeks ago with an associated severe sharp right upper quadrant abdominal pain with radiation to her back. This lasted for a few hours and resolved with medication. Workup revealed gallstones without gallbladder wall thickening or abnormal lab function. She states she's had 2 episodes like this after eating. The pain lasts minutes to hours sharp in nature with radiation her back associated with nausea and vomiting.                Right upper quadrant abdominal pain since 4 Kelseym. today. Morbid obesity.  EXAM: ULTRASOUND ABDOMEN LIMITED RIGHT UPPER QUADRANT  COMPARISON: Abdomen and pelvis CT obtained earlier today.  FINDINGS: Gallbladder:  Large number of gallstones in the gallbladder measuring up to 8 mm in maximum diameter each. No gallbladder wall thickening or pericholecystic fluid. No sonographic Murphy sign.  Common bile duct:  Diameter: 0.4 mm  Liver:  Diffusely echogenic and mildly heterogeneous. Oval, relatively hypoechoic areas adjacent to the gallbladder fossa. Corresponding previously suspected hepatic steatosis with areas of sparing adjacent to the gallbladder fossa. Portal vein is patent on color Doppler imaging with normal direction of blood flow towards the liver.  Other: None.  IMPRESSION: 1. Cholelithiasis without evidence of acute cholecystitis. 2. Diffuse hepatic steatosis with areas of sparing adjacent to the gallbladder fossa.   Electronically Signed By: Kelsey Lucas M.D. On: 04/06/2020 10:50.  The patient is a 29 year old female.   Past Surgical History Kelsey Lucas, [place];  04/27/2020 1:38 PM) No pertinent past surgical history  Diagnostic Studies History Kelsey Lucas, [place]; 04/27/2020 1:38 PM) Colonoscopy never Mammogram never Pap Smear 1-5 years ago  Allergies Kelsey Lucas, CMA; 04/27/2020 1:39 PM) Ibuprofen *ANALGESICS - ANTI-INFLAMMATORY* Allergies Reconciled  Medication History Kelsey Lucas, CMA; 04/27/2020 1:44 PM) metFORMIN HCl (500MG  Tablet, Oral) Active. Medications Reconciled  Social History , Kelsey Lucas; 04/27/2020 1:39 PM) Caffeine use Tea. No alcohol use No drug use Tobacco use Never smoker.  Family History 06/27/2020, Kelsey Lucas; 04/27/2020 1:39 PM) Alcohol Abuse Father. Arthritis Brother, Father, Mother. Cancer Father. Cerebrovascular Accident Mother. Diabetes Mellitus Mother. Heart Disease Brother, Mother. Heart disease in female family member before age 43 Hypertension Brother, Mother.  Pregnancy / Birth History 75, Kelsey Lucas; 04/27/2020 1:39 PM) Age at menarche 10 years. Gravida 0 Irregular periods Para 0  Other Problems 06/27/2020, Kelsey Lucas; 04/27/2020 1:39 PM) Arthritis     Review of Systems 06/27/2020 CMA; 04/27/2020 1:39 PM) General Not Present- Appetite Loss, Chills, Fatigue, Fever, Night Sweats, Weight Gain and Weight Loss. Skin Present- Dryness. Not Present- Change in Wart/Mole, Hives, Jaundice, New Lesions, Non-Healing Wounds, Rash and Ulcer. HEENT Present- Wears glasses/contact lenses. Not Present- Earache, Hearing Loss, Hoarseness, Nose Bleed, Oral Ulcers, Ringing in the Ears, Seasonal Allergies, Sinus Pain, Sore Throat, Visual Disturbances and Yellow Eyes. Respiratory Not Present- Bloody sputum, Chronic Cough, Difficulty Breathing, Snoring and Wheezing. Breast Not Present- Breast Mass, Breast Pain, Nipple Discharge and Skin Changes. Cardiovascular Not Present- Chest Pain, Difficulty Breathing Lying Down, Leg Cramps, Palpitations, Rapid Heart Rate, Shortness  of Breath and Swelling of Extremities. Gastrointestinal Present- Abdominal Pain and Bloating. Not Present- Bloody Stool, Change in Bowel Habits, Chronic diarrhea, Constipation, Difficulty Swallowing, Excessive gas,  Gets full quickly at meals, Hemorrhoids, Indigestion, Nausea, Rectal Pain and Vomiting. Female Genitourinary Not Present- Frequency, Nocturia, Painful Urination, Pelvic Pain and Urgency. Musculoskeletal Present- Joint Pain and Joint Stiffness. Not Present- Back Pain, Muscle Pain, Muscle Weakness and Swelling of Extremities. Neurological Not Present- Decreased Memory, Fainting, Headaches, Numbness, Seizures, Tingling, Tremor, Trouble walking and Weakness. Psychiatric Not Present- Anxiety, Bipolar, Change in Sleep Pattern, Depression, Fearful and Frequent crying. Endocrine Not Present- Cold Intolerance, Excessive Hunger, Hair Changes, Heat Intolerance, Hot flashes and New Diabetes. Hematology Not Present- Blood Thinners, Easy Bruising, Excessive bleeding, Gland problems, HIV and Persistent Infections.  Vitals Kelsey Lucas CMA; 04/27/2020 1:39 PM) 04/27/2020 1:39 PM Weight: 326.2 lb Height: 65in Body Surface Area: 2.44 m Body Mass Index: 54.28 kg/m  Temp.: 98.32F  Pulse: 108 (Regular)  BP: 132/82(Sitting, Left Arm, Standard)        Physical Exam (Kelsey Craney A. Shareta Fishbaugh MD; 04/27/2020 2:28 PM)  General Mental Status-Alert. General Appearance-Consistent with stated age. Hydration-Well hydrated. Voice-Normal.  Head and Neck Head-normocephalic, atraumatic with no lesions or palpable masses.  Eye Eyeball - Bilateral-Extraocular movements intact. Sclera/Conjunctiva - Bilateral-No scleral icterus.  Chest and Lung Exam Chest and lung exam reveals -quiet, even and easy respiratory effort with no use of accessory muscles and on auscultation, normal breath sounds, no adventitious sounds and normal vocal resonance. Inspection Chest Wall - Normal. Back -  normal.  Cardiovascular Cardiovascular examination reveals -on palpation PMI is normal in location and amplitude, no palpable S3 or S4. Normal cardiac borders., normal heart sounds, regular rate and rhythm with no murmurs, carotid auscultation reveals no bruits and normal pedal pulses bilaterally.  Abdomen Inspection Inspection of the abdomen reveals - No Hernias. Skin - Scar - no surgical scars. Palpation/Percussion Palpation and Percussion of the abdomen reveal - Soft, Non Tender, No Rebound tenderness, No Rigidity (guarding) and No hepatosplenomegaly. Auscultation Auscultation of the abdomen reveals - Bowel sounds normal.  Neurologic Neurologic evaluation reveals -alert and oriented x 3 with no impairment of recent or remote memory. Mental Status-Normal.  Musculoskeletal Normal Exam - Left-Upper Extremity Strength Normal and Lower Extremity Strength Normal. Normal Exam - Right-Upper Extremity Strength Normal, Lower Extremity Weakness.    Assessment & Plan (Genasis Zingale A. Lurlene Ronda MD; 04/27/2020 2:30 PM)  SYMPTOMATIC CHOLELITHIASIS (K80.20) Impression: Discussion of options of treatment gallstones. The pro and t cons of medical/ surgical options discussed. The patient's discharge and laparoscopic cholecystectomy with possible intraoperative cholangiogram The procedure has been discussed with the patient. Risks of laparoscopic cholecystectomy include bleeding, infection, bile duct injury, leak, death, open surgery, diarrhea, other surgery, organ injury, blood vessel injury, DVT, and additional care.  Time 45 minutes discussing surgery, examination, chart review, potential complications, nonoperative management and long-term expectations as well as documentation  Current Plans You are being scheduled for surgery- Our schedulers will call you.  You should hear from our office's scheduling department within 5 working days about the location, date, and time of surgery. We try to  make accommodations for patient's preferences in scheduling surgery, but sometimes the OR schedule or the surgeon's schedule prevents [place] from making those accommodations.  If you have not heard from our office [phone]) in 5 working days, call the office and ask for your surgeon's nurse.  If you have other questions about your diagnosis, plan, or surgery, call the office and ask for your surgeon's nurse.  Pt Education - Pamphlet Given - Laparoscopic Gallbladder Surgery: discussed with patient and provided information. The anatomy & physiology of hepatobiliary &  pancreatic function was discussed. The pathophysiology of gallbladder dysfunction was discussed. Natural history risks without surgery was discussed. I feel the risks of no intervention will lead to serious problems that outweigh the operative risks; therefore, I recommended cholecystectomy to remove the pathology. I explained laparoscopic techniques with possible need for an open approach. Probable cholangiogram to evaluate the bilary tract was explained as well.  Risks such as bleeding, infection, abscess, leak, injury to other organs, need for further treatment, heart attack, death, and other risks were discussed. I noted a good likelihood this will help address the problem. Possibility that this will not correct all abdominal symptoms was explained. Goals of post-operative recovery were discussed as well. We will work to minimize complications. An educational handout further explaining the pathology and treatment options was given as well. Questions were answered. The patient expresses understanding & wishes to proceed with surgery.  Pt Education - Laparoscopic Cholecystectomy: gallbladder Pt Education - [facility] Good Bowel Health (Gross) 

## 2020-04-27 NOTE — H&P (Signed)
Kelsey Lucas Appointment: 04/27/2020 1:50 PM Location: [facility] Patient #: [id number] DOB: [birth year] Single / Language: [place] / Race: Black or African American Female  History of Present Illness Kelsey Fus A. Ji Feldner MD; 04/27/2020 2:27 PM) Patient words: Patient sent to request of emergency room due to abdominal pain. She seen over 2 weeks ago with an associated severe sharp right upper quadrant abdominal pain with radiation to her back. This lasted for a few hours and resolved with medication. Workup revealed gallstones without gallbladder wall thickening or abnormal lab function. She states she's had 2 episodes like this after eating. The pain lasts minutes to hours sharp in nature with radiation her back associated with nausea and vomiting.                Right upper quadrant abdominal pain since 4 a.m. today. Morbid obesity.  EXAM: ULTRASOUND ABDOMEN LIMITED RIGHT UPPER QUADRANT  COMPARISON: Abdomen and pelvis CT obtained earlier today.  FINDINGS: Gallbladder:  Large number of gallstones in the gallbladder measuring up to 8 mm in maximum diameter each. No gallbladder wall thickening or pericholecystic fluid. No sonographic Murphy sign.  Common bile duct:  Diameter: 0.4 mm  Liver:  Diffusely echogenic and mildly heterogeneous. Oval, relatively hypoechoic areas adjacent to the gallbladder fossa. Corresponding previously suspected hepatic steatosis with areas of sparing adjacent to the gallbladder fossa. Portal vein is patent on color Doppler imaging with normal direction of blood flow towards the liver.  Other: None.  IMPRESSION: 1. Cholelithiasis without evidence of acute cholecystitis. 2. Diffuse hepatic steatosis with areas of sparing adjacent to the gallbladder fossa.   Electronically Signed By: Kelsey Lucas M.D. On: 04/06/2020 10:50.  The patient is a 29 year old female.   Past Surgical History Kelsey Lucas, [place];  04/27/2020 1:38 PM) No pertinent past surgical history  Diagnostic Studies History Kelsey Lucas, [place]; 04/27/2020 1:38 PM) Colonoscopy never Mammogram never Pap Smear 1-5 years ago  Allergies Kelsey Lucas, CMA; 04/27/2020 1:39 PM) Ibuprofen *ANALGESICS - ANTI-INFLAMMATORY* Allergies Reconciled  Medication History Kelsey Lucas, CMA; 04/27/2020 1:44 PM) metFORMIN HCl (500MG  Tablet, Oral) Active. Medications Reconciled  Social History , Kelsey Lucas; 04/27/2020 1:39 PM) Caffeine use Tea. No alcohol use No drug use Tobacco use Never smoker.  Family History 06/27/2020, Kelsey Lucas; 04/27/2020 1:39 PM) Alcohol Abuse Father. Arthritis Brother, Father, Mother. Cancer Father. Cerebrovascular Accident Mother. Diabetes Mellitus Mother. Heart Disease Brother, Mother. Heart disease in female family member before age 43 Hypertension Brother, Mother.  Pregnancy / Birth History 75, Kelsey Lucas; 04/27/2020 1:39 PM) Age at menarche 10 years. Gravida 0 Irregular periods Para 0  Other Problems 06/27/2020, Kelsey Lucas; 04/27/2020 1:39 PM) Arthritis     Review of Systems 06/27/2020 CMA; 04/27/2020 1:39 PM) General Not Present- Appetite Loss, Chills, Fatigue, Fever, Night Sweats, Weight Gain and Weight Loss. Skin Present- Dryness. Not Present- Change in Wart/Mole, Hives, Jaundice, New Lesions, Non-Healing Wounds, Rash and Ulcer. HEENT Present- Wears glasses/contact lenses. Not Present- Earache, Hearing Loss, Hoarseness, Nose Bleed, Oral Ulcers, Ringing in the Ears, Seasonal Allergies, Sinus Pain, Sore Throat, Visual Disturbances and Yellow Eyes. Respiratory Not Present- Bloody sputum, Chronic Cough, Difficulty Breathing, Snoring and Wheezing. Breast Not Present- Breast Mass, Breast Pain, Nipple Discharge and Skin Changes. Cardiovascular Not Present- Chest Pain, Difficulty Breathing Lying Down, Leg Cramps, Palpitations, Rapid Heart Rate, Shortness  of Breath and Swelling of Extremities. Gastrointestinal Present- Abdominal Pain and Bloating. Not Present- Bloody Stool, Change in Bowel Habits, Chronic diarrhea, Constipation, Difficulty Swallowing, Excessive gas,  Gets full quickly at meals, Hemorrhoids, Indigestion, Nausea, Rectal Pain and Vomiting. Female Genitourinary Not Present- Frequency, Nocturia, Painful Urination, Pelvic Pain and Urgency. Musculoskeletal Present- Joint Pain and Joint Stiffness. Not Present- Back Pain, Muscle Pain, Muscle Weakness and Swelling of Extremities. Neurological Not Present- Decreased Memory, Fainting, Headaches, Numbness, Seizures, Tingling, Tremor, Trouble walking and Weakness. Psychiatric Not Present- Anxiety, Bipolar, Change in Sleep Pattern, Depression, Fearful and Frequent crying. Endocrine Not Present- Cold Intolerance, Excessive Hunger, Hair Changes, Heat Intolerance, Hot flashes and New Diabetes. Hematology Not Present- Blood Thinners, Easy Bruising, Excessive bleeding, Gland problems, HIV and Persistent Infections.  Vitals Kelsey Lucas CMA; 04/27/2020 1:39 PM) 04/27/2020 1:39 PM Weight: 326.2 lb Height: 65in Body Surface Area: 2.44 m Body Mass Index: 54.28 kg/m  Temp.: 98.32F  Pulse: 108 (Regular)  BP: 132/82(Sitting, Left Arm, Standard)        Physical Exam (Kelsey Masters A. Cielo Arias MD; 04/27/2020 2:28 PM)  General Mental Status-Alert. General Appearance-Consistent with stated age. Hydration-Well hydrated. Voice-Normal.  Head and Neck Head-normocephalic, atraumatic with no lesions or palpable masses.  Eye Eyeball - Bilateral-Extraocular movements intact. Sclera/Conjunctiva - Bilateral-No scleral icterus.  Chest and Lung Exam Chest and lung exam reveals -quiet, even and easy respiratory effort with no use of accessory muscles and on auscultation, normal breath sounds, no adventitious sounds and normal vocal resonance. Inspection Chest Wall - Normal. Back -  normal.  Cardiovascular Cardiovascular examination reveals -on palpation PMI is normal in location and amplitude, no palpable S3 or S4. Normal cardiac borders., normal heart sounds, regular rate and rhythm with no murmurs, carotid auscultation reveals no bruits and normal pedal pulses bilaterally.  Abdomen Inspection Inspection of the abdomen reveals - No Hernias. Skin - Scar - no surgical scars. Palpation/Percussion Palpation and Percussion of the abdomen reveal - Soft, Non Tender, No Rebound tenderness, No Rigidity (guarding) and No hepatosplenomegaly. Auscultation Auscultation of the abdomen reveals - Bowel sounds normal.  Neurologic Neurologic evaluation reveals -alert and oriented x 3 with no impairment of recent or remote memory. Mental Status-Normal.  Musculoskeletal Normal Exam - Left-Upper Extremity Strength Normal and Lower Extremity Strength Normal. Normal Exam - Right-Upper Extremity Strength Normal, Lower Extremity Weakness.    Assessment & Plan (Keniya Schlotterbeck A. Jodee Wagenaar MD; 04/27/2020 2:30 PM)  SYMPTOMATIC CHOLELITHIASIS (K80.20) Impression: Discussion of options of treatment gallstones. The pro and t cons of medical/ surgical options discussed. The patient's discharge and laparoscopic cholecystectomy with possible intraoperative cholangiogram The procedure has been discussed with the patient. Risks of laparoscopic cholecystectomy include bleeding, infection, bile duct injury, leak, death, open surgery, diarrhea, other surgery, organ injury, blood vessel injury, DVT, and additional care.  Time 45 minutes discussing surgery, examination, chart review, potential complications, nonoperative management and long-term expectations as well as documentation  Current Plans You are being scheduled for surgery- Our schedulers will call you.  You should hear from our office's scheduling department within 5 working days about the location, date, and time of surgery. We try to  make accommodations for patient's preferences in scheduling surgery, but sometimes the OR schedule or the surgeon's schedule prevents [place] from making those accommodations.  If you have not heard from our office [phone]) in 5 working days, call the office and ask for your surgeon's nurse.  If you have other questions about your diagnosis, plan, or surgery, call the office and ask for your surgeon's nurse.  Pt Education - Pamphlet Given - Laparoscopic Gallbladder Surgery: discussed with patient and provided information. The anatomy & physiology of hepatobiliary &  pancreatic function was discussed. The pathophysiology of gallbladder dysfunction was discussed. Natural history risks without surgery was discussed. I feel the risks of no intervention will lead to serious problems that outweigh the operative risks; therefore, I recommended cholecystectomy to remove the pathology. I explained laparoscopic techniques with possible need for an open approach. Probable cholangiogram to evaluate the bilary tract was explained as well.  Risks such as bleeding, infection, abscess, leak, injury to other organs, need for further treatment, heart attack, death, and other risks were discussed. I noted a good likelihood this will help address the problem. Possibility that this will not correct all abdominal symptoms was explained. Goals of post-operative recovery were discussed as well. We will work to minimize complications. An educational handout further explaining the pathology and treatment options was given as well. Questions were answered. The patient expresses understanding & wishes to proceed with surgery.  Pt Education - Laparoscopic Cholecystectomy: gallbladder Pt Education - [facility] Good Bowel Health ([place])

## 2020-05-13 NOTE — Pre-Procedure Instructions (Signed)
Chardae Mulkern  05/13/2020      [id number] [address] [place] [address] Phone: [phone] Fax: [phone]    Your procedure is scheduled on June 3  Report to [facility] Entrance A at 7:30 A.M.  Call this number if you have problems the morning of surgery:  [phone]   Remember:  Do not eat after midnight.  You may drink clear liquids until 6:30 A.M. .  Clear liquids allowed are:                    Water, Juice (non-citric and without pulp - diabetics please choose diet or no sugar options), Carbonated beverages - (diabetics please choose diet or no sugar options), Clear Tea, Black Coffee only (no creamer, milk or cream including half and half), Plain Jell-O only (diabetics please choose diet or no sugar options), Gatorade (diabetics please choose diet or no sugar options) and Plain Popsicles only    Take these medicines the morning of surgery with A SIP OF WATER :              Medroxyprogesterone (provera)             ogestrol             zofran if needed        7 days prior to surgery STOP taking any Aspirin (unless otherwise instructed by your surgeon), Aleve, Naproxen, Ibuprofen, Motrin, Advil, Goody's, BC's, all herbal medications, fish oil, and all vitamins.    Do not wear jewelry, make-up or nail polish.  Do not wear lotions, powders, or perfumes, or deodorant.  Do not shave 48 hours prior to surgery.  Men may shave face and neck.  Do not bring valuables to the hospital.  [facility] is not responsible for any belongings or valuables.  Contacts, dentures or bridgework may not be worn into surgery.  Leave your suitcase in the car.  After surgery it may be brought to your room.  For patients admitted to the hospital, discharge time will be determined by your treatment team.  Patients discharged the day of surgery will not be allowed to drive home.    Special instructions:   [place]- Preparing For  Surgery  Before surgery, you can play an important role. Because skin is not sterile, your skin needs to be as free of germs as possible. You can reduce the number of germs on your skin by washing with CHG (chlorahexidine gluconate) Soap before surgery.  CHG is an antiseptic cleaner which kills germs and bonds with the skin to continue killing germs even after washing.    Oral Hygiene is also important to reduce your risk of infection.  Remember - BRUSH YOUR TEETH THE MORNING OF SURGERY WITH YOUR REGULAR TOOTHPASTE  Please do not use if you have an allergy to CHG or antibacterial soaps. If your skin becomes reddened/irritated stop using the CHG.  Do not shave (including legs and underarms) for at least 48 hours prior to first CHG shower. It is OK to shave your face.  Please follow these instructions carefully.   1. Shower the NIGHT BEFORE SURGERY and the MORNING OF SURGERY with CHG.   2. If you chose to wash your hair, wash your hair first as usual with your normal shampoo.  3. After you shampoo, rinse your hair and body thoroughly to remove the shampoo.  4. Use CHG as you would  any other liquid soap. You can apply CHG directly to the skin and wash gently with a scrungie or a clean washcloth.   5. Apply the CHG Soap to your body ONLY FROM THE NECK DOWN.  Do not use on open wounds or open sores. Avoid contact with your eyes, ears, mouth and genitals (private parts). Wash Face and genitals (private parts)  with your normal soap.  6. Wash thoroughly, paying special attention to the area where your surgery will be performed.  7. Thoroughly rinse your body with warm water from the neck down.  8. DO NOT shower/wash with your normal soap after using and rinsing off the CHG Soap.  9. Pat yourself dry with a CLEAN TOWEL.  10. Wear CLEAN PAJAMAS to bed the night before surgery, wear comfortable clothes the morning of surgery  11. Place CLEAN SHEETS on your bed the night of your first shower and  DO NOT SLEEP WITH PETS.    Day of Surgery:  Do not apply any deodorants/lotions.  Please wear clean clothes to the hospital/surgery center.   Remember to brush your teeth WITH YOUR REGULAR TOOTHPASTE.    Please read over the following fact sheets that you were given. Coughing and Deep Breathing and Surgical Site Infection Prevention

## 2020-05-13 NOTE — Pre-Procedure Instructions (Signed)
Your procedure is scheduled on Thursday, June 3rd, 2021 from 09:30 AM- 11:00 AM.   Report to [facility] Entrance "A" at 07:30 A.M., and check in at the Admitting office.  Call this number if you have problems the morning of surgery:  [phone]  Call [phone] if you have any questions prior to your surgery date Monday-Friday 8am-4pm.    Remember:  Do not eat after midnight the night before your surgery.  You may drink clear liquids until 06:30 AM the morning of your surgery.   Clear liquids allowed are: Water, Non-Citrus Juices (without pulp), Carbonated Beverages, Clear Tea, Black Coffee Only, and Gatorade.    Take these medicines the morning of surgery with A SIP OF WATER: medroxyPROGESTERone (PROVERA) norgestrel-ethinyl estradiol (OGESTROL)   IF NEEDED: ondansetron (ZOFRAN ODT)   As of today, STOP taking any Aspirin (unless otherwise instructed by your surgeon) and Aspirin containing products, Aleve, Naproxen, Ibuprofen, Motrin, Advil, Goody's, BC's, all herbal medications, fish oil, and all vitamins.   *Do not take metFORMIN (GLUCOPHAGE) the morning of surgery.*           The Day of Surgery:            Do not wear jewelry, make up, or nail polish.            Do not wear lotions, powders, perfumes, or deodorant.            Do not shave 48 hours prior to surgery.              Do not bring valuables to the hospital.            [facility] is not responsible for any belongings or valuables.  Do NOT Smoke (Tobacco/Vapping) or drink Alcohol 24 hours prior to your procedure.  If you use a CPAP at night, you may bring all equipment for your overnight stay.   Contacts, glasses, dentures or bridgework may not be worn into surgery.      For patients admitted to the hospital, discharge time will be determined by your treatment team.   Patients discharged the day of surgery will not be allowed to drive home, and someone needs to stay with them for 24  hours.    Special instructions:   - Preparing For Surgery  Before surgery, you can play an important role. Because skin is not sterile, your skin needs to be as free of germs as possible. You can reduce the number of germs on your skin by washing with CHG (chlorahexidine gluconate) Soap before surgery.  CHG is an antiseptic cleaner which kills germs and bonds with the skin to continue killing germs even after washing.    Oral Hygiene is also important to reduce your risk of infection.  Remember - BRUSH YOUR TEETH THE MORNING OF SURGERY WITH YOUR REGULAR TOOTHPASTE  Please do not use if you have an allergy to CHG or antibacterial soaps. If your skin becomes reddened/irritated stop using the CHG.  Do not shave (including legs and underarms) for at least 48 hours prior to first CHG shower. It is OK to shave your face.  Please follow these instructions carefully.   1. Shower the NIGHT BEFORE SURGERY and the MORNING OF SURGERY with CHG Soap.   2. If you chose to wash your hair, wash your hair first as usual with your normal shampoo.  3. After you shampoo, rinse your hair and body thoroughly to remove the shampoo.  4.  Use CHG as you would any other liquid soap. You can apply CHG directly to the skin and wash gently with a scrungie or a clean washcloth.   5. Apply the CHG Soap to your body ONLY FROM THE NECK DOWN.  Do not use on open wounds or open sores. Avoid contact with your eyes, ears, mouth and genitals (private parts). Wash Face and genitals (private parts)  with your normal soap.   6. Wash thoroughly, paying special attention to the area where your surgery will be performed.  7. Thoroughly rinse your body with warm water from the neck down.  8. DO NOT shower/wash with your normal soap after using and rinsing off the CHG Soap.  9. Pat yourself dry with a CLEAN TOWEL.  10. Wear CLEAN PAJAMAS to bed the night before surgery, wear comfortable clothes the morning of  surgery  11. Place CLEAN SHEETS on your bed the night of your first shower and DO NOT SLEEP WITH PETS.   Day of Surgery:   Do not apply any deodorants/lotions.  Please wear clean clothes to the hospital/surgery center.   Remember to brush your teeth WITH YOUR REGULAR TOOTHPASTE.   Please read over the following fact sheets that you were given.

## 2020-05-14 ENCOUNTER — Other Ambulatory Visit: Payer: Self-pay

## 2020-05-14 ENCOUNTER — Encounter (HOSPITAL_COMMUNITY)
Admission: RE | Admit: 2020-05-14 | Discharge: 2020-05-14 | Disposition: A | Discharge status: Home / Self Care | Payer: No Typology Code available for payment source | Source: Ambulatory Visit | Attending: Surgery | Admitting: Surgery

## 2020-05-14 ENCOUNTER — Encounter (HOSPITAL_COMMUNITY): Payer: Self-pay

## 2020-05-14 DIAGNOSIS — Z01812 Encounter for preprocedural laboratory examination: Secondary | ICD-10-CM | POA: Diagnosis not present

## 2020-05-14 HISTORY — DX: Polycystic ovarian syndrome: E28.2

## 2020-05-14 HISTORY — DX: Prediabetes: R73.03

## 2020-05-14 LAB — COMPREHENSIVE METABOLIC PANEL
ALT: 43 U/L (ref 0–44)
AST: 27 U/L (ref 15–41)
Albumin: 3.7 g/dL (ref 3.5–5.0)
Alkaline Phosphatase: 75 U/L (ref 38–126)
Anion gap: 9 (ref 5–15)
BUN: 7 mg/dL (ref 6–20)
CO2: 27 mmol/L (ref 22–32)
Calcium: 9.3 mg/dL (ref 8.9–10.3)
Chloride: 103 mmol/L (ref 98–111)
Creatinine, Ser: 0.81 mg/dL (ref 0.44–1.00)
GFR calc Af Amer: >60 mL/min (ref >60)
GFR calc non Af Amer: >60 mL/min (ref >60)
Glucose, Bld: 135 mg/dL — ABNORMAL HIGH (ref 70–99)
Potassium: 3.5 mmol/L (ref 3.5–5.1)
Sodium: 139 mmol/L (ref 135–145)
Total Bilirubin: 1.1 mg/dL (ref 0.3–1.2)
Total Protein: 7.4 g/dL (ref 6.5–8.1)

## 2020-05-14 LAB — CBC WITH DIFFERENTIAL/PLATELET
Abs Immature Granulocytes: 0.02 10*3/uL (ref 0.00–0.07)
Basophils Absolute: 0.1 10*3/uL (ref 0.0–0.1)
Basophils Relative: 2 %
Eosinophils Absolute: 0.3 10*3/uL (ref 0.0–0.5)
Eosinophils Relative: 4 %
HCT: 44 % (ref 36.0–46.0)
Hemoglobin: 13.6 g/dL (ref 12.0–15.0)
Immature Granulocytes: 0 %
Lymphocytes Relative: 33 %
Lymphs Abs: 2.2 10*3/uL (ref 0.7–4.0)
MCH: 25.9 pg — ABNORMAL LOW (ref 26.0–34.0)
MCHC: 30.9 g/dL (ref 30.0–36.0)
MCV: 83.7 fL (ref 80.0–100.0)
Monocytes Absolute: 0.6 10*3/uL (ref 0.1–1.0)
Monocytes Relative: 10 %
Neutro Abs: 3.4 10*3/uL (ref 1.7–7.7)
Neutrophils Relative %: 51 %
Platelets: 352 10*3/uL (ref 150–400)
RBC: 5.26 MIL/uL — ABNORMAL HIGH (ref 3.87–5.11)
RDW: 14.6 % (ref 11.5–15.5)
WBC: 6.6 10*3/uL (ref 4.0–10.5)
nRBC: 0 % (ref 0.0–0.2)

## 2020-05-14 LAB — HEMOGLOBIN A1C
Hgb A1c MFr Bld: 6.7 % — ABNORMAL HIGH (ref 4.8–5.6)
Mean Plasma Glucose: 145.59 mg/dL

## 2020-05-14 LAB — GLUCOSE, CAPILLARY: Glucose-Capillary: 113 mg/dL — ABNORMAL HIGH (ref 70–99)

## 2020-05-14 NOTE — Progress Notes (Signed)
PCP - Denies Cardiologist - Denies OB/GYN- Waynard Reeds, MD  PPM/ICD - Denies  Chest x-ray - N/A EKG - 12/31/19 Stress Test - Denies ECHO - Denies Cardiac Cath - Denies  Sleep Study - Denies CPAP - N/A  Patient states she is pre-diabetic, does not check her blood sugar. Pre-diabetes managed by Waynard Reeds, MD. CBG at PAT appointment was 113. A1C obtained.  Blood Thinner Instructions: N/A Aspirin Instructions: N/A  ERAS Protcol - Yes PRE-SURGERY Ensure or G2- Not ordered  COVID TEST- 05/19/20 @ 0905 @ [facility]   Anesthesia review: No  Patient denies shortness of breath, fever, cough and chest pain at PAT appointment   All instructions explained to the patient, with a verbal understanding of the material. Patient agrees to go over the instructions while at home for a better understanding. Patient also instructed to self quarantine after being tested for COVID-19. The opportunity to ask questions was provided.

## 2020-05-19 ENCOUNTER — Other Ambulatory Visit (HOSPITAL_COMMUNITY)
Admission: RE | Admit: 2020-05-19 | Discharge: 2020-05-19 | Disposition: A | Discharge status: Home / Self Care | Payer: No Typology Code available for payment source | Source: Ambulatory Visit | Attending: Surgery | Admitting: Surgery

## 2020-05-19 DIAGNOSIS — Z20822 Contact with and (suspected) exposure to covid-19: Secondary | ICD-10-CM | POA: Insufficient documentation

## 2020-05-19 DIAGNOSIS — Z01812 Encounter for preprocedural laboratory examination: Secondary | ICD-10-CM | POA: Insufficient documentation

## 2020-05-19 LAB — SARS CORONAVIRUS 2 (TAT 6-24 HRS): SARS Coronavirus 2: NEGATIVE

## 2020-05-20 NOTE — Anesthesia Preprocedure Evaluation (Addendum)
Anesthesia Evaluation  Patient identified by MRN, date of birth, ID band Patient awake    Reviewed: Allergy & Precautions, NPO status , Patient's Chart, lab work & pertinent test results  Airway Mallampati: II  TM Distance: >3 FB Neck ROM: Full    Dental no notable dental hx. (+) Teeth Intact, Dental Advisory Given   Pulmonary neg pulmonary ROS,    Pulmonary exam normal breath sounds clear to auscultation       Cardiovascular negative cardio ROS Normal cardiovascular exam Rhythm:Regular Rate:Normal     Neuro/Psych negative neurological ROS     GI/Hepatic negative GI ROS, Neg liver ROS,   Endo/Other  Morbid obesity  Renal/GU K+ 3,5     Musculoskeletal negative musculoskeletal ROS (+)   Abdominal (+) + obese,   Peds  Hematology hgb 13.6   Anesthesia Other Findings   Reproductive/Obstetrics negative OB ROS                            Anesthesia Physical Anesthesia Plan  ASA: III  Anesthesia Plan: General   Post-op Pain Management:    Induction: Intravenous  PONV Risk Score and Plan: 4 or greater and Treatment may vary due to age or medical condition, Midazolam, Dexamethasone and Ondansetron  Airway Management Planned: Oral ETT  Additional Equipment: None  Intra-op Plan:   Post-operative Plan: Extubation in OR  Informed Consent: I have reviewed the patients History and Physical, chart, labs and discussed the procedure including the risks, benefits and alternatives for the proposed anesthesia with the patient or authorized representative who has indicated his/her understanding and acceptance.     Dental advisory given  Plan Discussed with: CRNA  Anesthesia Plan Comments:        Anesthesia Quick Evaluation

## 2020-05-21 ENCOUNTER — Encounter (HOSPITAL_COMMUNITY)
Admission: RE | Disposition: A | Discharge status: Home / Self Care | Payer: Self-pay | Source: Home / Self Care | Attending: Surgery

## 2020-05-21 ENCOUNTER — Ambulatory Visit (HOSPITAL_COMMUNITY)
Admission: RE | Admit: 2020-05-21 | Discharge: 2020-05-21 | Disposition: A | Discharge status: Home / Self Care | Payer: No Typology Code available for payment source | Attending: Surgery | Admitting: Surgery

## 2020-05-21 ENCOUNTER — Ambulatory Visit (HOSPITAL_COMMUNITY): Payer: No Typology Code available for payment source | Admitting: Anesthesiology

## 2020-05-21 ENCOUNTER — Other Ambulatory Visit: Payer: Self-pay

## 2020-05-21 ENCOUNTER — Encounter (HOSPITAL_COMMUNITY): Payer: Self-pay | Admitting: Surgery

## 2020-05-21 DIAGNOSIS — Z6841 Body Mass Index (BMI) 40.0 and over, adult: Secondary | ICD-10-CM | POA: Insufficient documentation

## 2020-05-21 DIAGNOSIS — M1990 Unspecified osteoarthritis, unspecified site: Secondary | ICD-10-CM | POA: Insufficient documentation

## 2020-05-21 DIAGNOSIS — K8010 Calculus of gallbladder with chronic cholecystitis without obstruction: Secondary | ICD-10-CM | POA: Diagnosis not present

## 2020-05-21 DIAGNOSIS — Z7984 Long term (current) use of oral hypoglycemic drugs: Secondary | ICD-10-CM | POA: Diagnosis not present

## 2020-05-21 DIAGNOSIS — Z886 Allergy status to analgesic agent status: Secondary | ICD-10-CM | POA: Insufficient documentation

## 2020-05-21 DIAGNOSIS — K811 Chronic cholecystitis: Secondary | ICD-10-CM | POA: Diagnosis present

## 2020-05-21 HISTORY — PX: CHOLECYSTECTOMY: SHX55

## 2020-05-21 LAB — GLUCOSE, CAPILLARY
Glucose-Capillary: 124 mg/dL — ABNORMAL HIGH (ref 70–99)
Glucose-Capillary: 163 mg/dL — ABNORMAL HIGH (ref 70–99)

## 2020-05-21 LAB — POCT PREGNANCY, URINE: Preg Test, Ur: NEGATIVE

## 2020-05-21 SURGERY — LAPAROSCOPIC CHOLECYSTECTOMY
Anesthesia: General | Site: Abdomen

## 2020-05-21 MED ORDER — GABAPENTIN 300 MG PO CAPS
300.0000 mg | ORAL_CAPSULE | ORAL | Status: AC
  Administered 2020-05-21: 300 mg via ORAL
  Filled 2020-05-21: qty 1

## 2020-05-21 MED ORDER — PROPOFOL 10 MG/ML IV BOLUS
INTRAVENOUS | Status: AC
  Filled 2020-05-21: qty 20

## 2020-05-21 MED ORDER — MIDAZOLAM HCL 2 MG/2ML IJ SOLN
INTRAMUSCULAR | Status: AC
  Filled 2020-05-21: qty 2

## 2020-05-21 MED ORDER — ONDANSETRON HCL 4 MG/2ML IJ SOLN
INTRAMUSCULAR | Status: DC | PRN
  Administered 2020-05-21: 4 mg via INTRAVENOUS

## 2020-05-21 MED ORDER — DEXAMETHASONE SODIUM PHOSPHATE 10 MG/ML IJ SOLN
INTRAMUSCULAR | Status: DC | PRN
Start: 2020-05-21 — End: 2020-05-21
  Administered 2020-05-21: 10 mg via INTRAVENOUS

## 2020-05-21 MED ORDER — SODIUM CHLORIDE 0.9 % IR SOLN
Status: DC | PRN
  Administered 2020-05-21: 1000 mL

## 2020-05-21 MED ORDER — SUCCINYLCHOLINE CHLORIDE 20 MG/ML IJ SOLN
INTRAMUSCULAR | Status: DC | PRN
Start: 2020-05-21 — End: 2020-05-21
  Administered 2020-05-21: 120 mg via INTRAVENOUS

## 2020-05-21 MED ORDER — SUGAMMADEX SODIUM 200 MG/2ML IV SOLN
INTRAVENOUS | Status: DC | PRN
  Administered 2020-05-21: 300 mg via INTRAVENOUS

## 2020-05-21 MED ORDER — BUPIVACAINE HCL (PF) 0.25 % IJ SOLN
INTRAMUSCULAR | Status: DC | PRN
  Administered 2020-05-21: 15 mL

## 2020-05-21 MED ORDER — DEXMEDETOMIDINE HCL IN NACL 200 MCG/50ML IV SOLN
INTRAVENOUS | Status: DC | PRN
  Administered 2020-05-21: 20 ug via INTRAVENOUS

## 2020-05-21 MED ORDER — CHLORHEXIDINE GLUCONATE 0.12 % MT SOLN
15.0000 mL | Freq: Once | OROMUCOSAL | Status: AC
  Administered 2020-05-21: 15 mL via OROMUCOSAL
  Filled 2020-05-21: qty 15

## 2020-05-21 MED ORDER — LACTATED RINGERS IV SOLN: INTRAVENOUS | Status: DC

## 2020-05-21 MED ORDER — MIDAZOLAM HCL 5 MG/5ML IJ SOLN
INTRAMUSCULAR | Status: DC | PRN
  Administered 2020-05-21: 2 mg via INTRAVENOUS

## 2020-05-21 MED ORDER — LIDOCAINE 2% (20 MG/ML) 5 ML SYRINGE
INTRAMUSCULAR | Status: DC | PRN
  Administered 2020-05-21: 100 mg via INTRAVENOUS

## 2020-05-21 MED ORDER — FENTANYL CITRATE (PF) 250 MCG/5ML IJ SOLN
INTRAMUSCULAR | Status: AC
  Filled 2020-05-21: qty 5

## 2020-05-21 MED ORDER — CHLORHEXIDINE GLUCONATE CLOTH 2 % EX PADS: 6.0000 | MEDICATED_PAD | Freq: Once | CUTANEOUS | Status: DC

## 2020-05-21 MED ORDER — MEPERIDINE HCL 25 MG/ML IJ SOLN: 6.2500 mg | INTRAMUSCULAR | Status: DC | PRN

## 2020-05-21 MED ORDER — OXYCODONE HCL 5 MG PO TABS
5.0000 mg | ORAL_TABLET | Freq: Four times a day (QID) | ORAL | 0 refills | Status: AC | PRN
Start: 2020-05-21 — End: ?

## 2020-05-21 MED ORDER — ACETAMINOPHEN 10 MG/ML IV SOLN: 1000.0000 mg | Freq: Once | INTRAVENOUS | Status: DC | PRN

## 2020-05-21 MED ORDER — ROCURONIUM BROMIDE 10 MG/ML (PF) SYRINGE
PREFILLED_SYRINGE | INTRAVENOUS | Status: DC | PRN
  Administered 2020-05-21: 10 mg via INTRAVENOUS
  Administered 2020-05-21: 50 mg via INTRAVENOUS

## 2020-05-21 MED ORDER — PHENYLEPHRINE 40 MCG/ML (10ML) SYRINGE FOR IV PUSH (FOR BLOOD PRESSURE SUPPORT)
PREFILLED_SYRINGE | INTRAVENOUS | Status: DC | PRN
  Administered 2020-05-21: 40 ug via INTRAVENOUS

## 2020-05-21 MED ORDER — HYDROCODONE-ACETAMINOPHEN 7.5-325 MG PO TABS
ORAL_TABLET | ORAL | Status: AC
  Filled 2020-05-21: qty 1

## 2020-05-21 MED ORDER — FENTANYL CITRATE (PF) 250 MCG/5ML IJ SOLN
INTRAMUSCULAR | Status: DC | PRN
  Administered 2020-05-21: 50 ug via INTRAVENOUS
  Administered 2020-05-21: 100 ug via INTRAVENOUS
  Administered 2020-05-21 (×2): 50 ug via INTRAVENOUS

## 2020-05-21 MED ORDER — PROPOFOL 10 MG/ML IV BOLUS
INTRAVENOUS | Status: DC | PRN
  Administered 2020-05-21: 200 mg via INTRAVENOUS

## 2020-05-21 MED ORDER — BUPIVACAINE HCL (PF) 0.25 % IJ SOLN
INTRAMUSCULAR | Status: AC
  Filled 2020-05-21: qty 30

## 2020-05-21 MED ORDER — 0.9 % SODIUM CHLORIDE (POUR BTL) OPTIME
TOPICAL | Status: DC | PRN
  Administered 2020-05-21: 1000 mL

## 2020-05-21 MED ORDER — ORAL CARE MOUTH RINSE: 15.0000 mL | Freq: Once | OROMUCOSAL | Status: AC

## 2020-05-21 MED ORDER — PROMETHAZINE HCL 25 MG/ML IJ SOLN: 6.2500 mg | INTRAMUSCULAR | Status: DC | PRN

## 2020-05-21 MED ORDER — HYDROMORPHONE HCL 1 MG/ML IJ SOLN: 0.2500 mg | INTRAMUSCULAR | Status: DC | PRN

## 2020-05-21 MED ORDER — HYDROCODONE-ACETAMINOPHEN 7.5-325 MG PO TABS
1.0000 | ORAL_TABLET | Freq: Once | ORAL | Status: AC | PRN
  Administered 2020-05-21: 1 via ORAL

## 2020-05-21 MED ORDER — DEXTROSE 5 % IV SOLN
3.0000 g | INTRAVENOUS | Status: AC
  Administered 2020-05-21: 3 g via INTRAVENOUS
  Filled 2020-05-21: qty 3000

## 2020-05-21 SURGICAL SUPPLY — 38 items
APPLIER CLIP ROT 10 11.4 M/L (STAPLE) ×3
BLADE CLIPPER SURG (BLADE) IMPLANT
CANISTER SUCT 3000ML PPV (MISCELLANEOUS) ×3 IMPLANT
CHLORAPREP W/TINT 26 (MISCELLANEOUS) ×3 IMPLANT
CLIP APPLIE ROT 10 11.4 M/L (STAPLE) ×1 IMPLANT
COVER SURGICAL LIGHT HANDLE (MISCELLANEOUS) ×3 IMPLANT
COVER WAND RF STERILE (DRAPES) ×3 IMPLANT
DERMABOND ADVANCED (GAUZE/BANDAGES/DRESSINGS) ×2
DERMABOND ADVANCED .7 DNX12 (GAUZE/BANDAGES/DRESSINGS) ×1 IMPLANT
DRAPE WARM FLUID 44X44 (DRAPES) ×3 IMPLANT
ELECT REM PT RETURN 9FT ADLT (ELECTROSURGICAL) ×3
ELECTRODE REM PT RTRN 9FT ADLT (ELECTROSURGICAL) ×1 IMPLANT
GLOVE BIO SURGEON STRL SZ8 (GLOVE) ×3 IMPLANT
GLOVE BIOGEL PI IND STRL 8 (GLOVE) ×1 IMPLANT
GLOVE BIOGEL PI INDICATOR 8 (GLOVE) ×2
GOWN STRL REUS W/ TWL LRG LVL3 (GOWN DISPOSABLE) ×2 IMPLANT
GOWN STRL REUS W/ TWL XL LVL3 (GOWN DISPOSABLE) ×1 IMPLANT
GOWN STRL REUS W/TWL LRG LVL3 (GOWN DISPOSABLE) ×4
GOWN STRL REUS W/TWL XL LVL3 (GOWN DISPOSABLE) ×2
KIT BASIN OR (CUSTOM PROCEDURE TRAY) ×3 IMPLANT
KIT TURNOVER KIT B (KITS) ×3 IMPLANT
NS IRRIG 1000ML POUR BTL (IV SOLUTION) ×3 IMPLANT
PAD ARMBOARD 7.5X6 YLW CONV (MISCELLANEOUS) ×3 IMPLANT
POUCH RETRIEVAL ECOSAC 10 (ENDOMECHANICALS) ×1 IMPLANT
POUCH RETRIEVAL ECOSAC 10MM (ENDOMECHANICALS) ×2
SCISSORS LAP 5X35 DISP (ENDOMECHANICALS) ×3 IMPLANT
SET IRRIG TUBING LAPAROSCOPIC (IRRIGATION / IRRIGATOR) ×3 IMPLANT
SET TUBE SMOKE EVAC HIGH FLOW (TUBING) ×3 IMPLANT
SLEEVE ENDOPATH XCEL 5M (ENDOMECHANICALS) ×3 IMPLANT
SPECIMEN JAR SMALL (MISCELLANEOUS) ×3 IMPLANT
SUT MNCRL AB 4-0 PS2 18 (SUTURE) ×5 IMPLANT
TOWEL GREEN STERILE (TOWEL DISPOSABLE) ×3 IMPLANT
TOWEL GREEN STERILE FF (TOWEL DISPOSABLE) ×3 IMPLANT
TRAY LAPAROSCOPIC MC (CUSTOM PROCEDURE TRAY) ×3 IMPLANT
TROCAR XCEL BLUNT TIP 100MML (ENDOMECHANICALS) ×3 IMPLANT
TROCAR XCEL NON-BLD 11X100MML (ENDOMECHANICALS) ×3 IMPLANT
TROCAR XCEL NON-BLD 5MMX100MML (ENDOMECHANICALS) ×3 IMPLANT
WATER STERILE IRR 1000ML POUR (IV SOLUTION) ×3 IMPLANT

## 2020-05-21 NOTE — Transfer of Care (Signed)
Immediate Anesthesia Transfer of Care Note  Patient: Kelsey Lucas  Procedure(s) Performed: LAPAROSCOPIC CHOLECYSTECTOMY (N/A Abdomen)  Patient Location: PACU  Anesthesia Type:General  Level of Consciousness: awake, alert  and oriented  Airway & Oxygen Therapy: Patient Spontanous Breathing and Patient connected to face mask oxygen  Post-op Assessment: Report given to RN and Post -op Vital signs reviewed and stable  Post vital signs: Reviewed and stable  Last Vitals:  Vitals Value Taken Time  BP 135/61 05/21/20 1120  Temp    Pulse 105 05/21/20 1123  Resp 21 05/21/20 1123  SpO2 93 % 05/21/20 1123  Vitals shown include unvalidated device data.  Last Pain:  Vitals:   05/21/20 1120  TempSrc:   PainSc: (P) Asleep         Complications: No apparent anesthesia complications

## 2020-05-21 NOTE — Op Note (Signed)
Laparoscopic Cholecystectomy  Procedure Note  Indications: This patient presents with symptomatic gallbladder disease and will undergo laparoscopic cholecystectomy. The procedure has been discussed with the patient. Operative and non operative treatments have been discussed. Risks of surgery include bleeding, infection,  Common bile duct injury,  Injury to the stomach,liver, colon,small intestine, abdominal wall,  Diaphragm,  Major blood vessels,  And the need for an open procedure.  Other risks include worsening of medical problems, death,  DVT and pulmonary embolism, and cardiovascular events.   Medical options have also been discussed. The patient has been informed of long term expectations of surgery and non surgical options,  The patient agrees to proceed.    Pre-operative Diagnosis: Chronic cholecystitis  Post-operative Diagnosis: Same  Surgeon: Dortha Schwalbe  MD   Assistants: [facility] RNFA   Anesthesia: General endotracheal anesthesia and Local anesthesia 0.25.% bupivacaine, with epinephrine  ASA Class: 2  Procedure Details  The patient was seen again in the Holding Room. The risks, benefits, complications, treatment options, and expected outcomes were discussed with the patient. The possibilities of reaction to medication, pulmonary aspiration, perforation of viscus, bleeding, recurrent infection, finding a normal gallbladder, the need for additional procedures, failure to diagnose a condition, the possible need to convert to an open procedure, and creating a complication requiring transfusion or operation were discussed with the patient. The patient and/or family concurred with the proposed plan, giving informed consent. The site of surgery properly noted/marked. The patient was taken to Operating Room, identified as Kelsey Lucas and the procedure verified as Laparoscopic Cholecystectomy with Intraoperative Cholangiograms. A Time Out was held and the above information  confirmed.  Prior to the induction of general anesthesia, antibiotic prophylaxis was administered. General endotracheal anesthesia was then administered and tolerated well. After the induction, the abdomen was prepped in the usual sterile fashion. The patient was positioned in the supine position with the left arm comfortably tucked, along with some reverse Trendelenburg.  Local anesthetic agent was injected into the skin near the umbilicus and an incision made. The midline fascia was incised and the Hasson technique was used to introduce a 12 mm port under direct vision. It was secured with a figure of eight Vicryl suture placed in the usual fashion. Pneumoperitoneum was then created with CO2 and tolerated well without any adverse changes in the patient's vital signs. Additional trocars were introduced under direct vision with an 11 mm trocar in the epigastrium and 2 5 mm trocars in the right upper quadrant. All skin incisions were infiltrated with a local anesthetic agent before making the incision and placing the trocars.   The gallbladder was identified, the fundus grasped and retracted cephalad. Adhesions were lysed bluntly and with the electrocautery where indicated, taking care not to injure any adjacent organs or viscus. The infundibulum was grasped and retracted laterally, exposing the peritoneum overlying the triangle of Calot. This was then divided and exposed in a blunt fashion. The cystic duct was clearly identified and bluntly dissected circumferentially. The junctions of the gallbladder, cystic duct and common bile duct were clearly identified prior to the division of any linear structure.   Critical view obtained.  The cystic duct was extremely small and cholangiogram was not performed.    The cystic duct was then  ligated with surgical clips  on the patient side and  clipped on the gallbladder side and divided. The cystic artery was identified, dissected free, ligated with clips and  divided as well. Posterior cystic artery clipped and divided.  The gallbladder was dissected from the liver bed in retrograde fashion with the electrocautery. The gallbladder was removed. The liver bed was irrigated and inspected. Hemostasis was achieved with the electrocautery. Copious irrigation was utilized and was repeatedly aspirated until clear all particulate matter. Hemostasis was achieved with no signs of bleeding or bile leakage.  Pneumoperitoneum was completely reduced after viewing removal of the trocars under direct vision. The wound was thoroughly irrigated and the fascia was then closed with a figure of eight suture; the skin was then closed with 4 0 monocryl and a sterile dressing of Dermabond was applied.  Instrument, sponge, and needle counts were correct at closure and at the conclusion of the case.   Findings: Cholecystitis with Cholelithiasis  Estimated Blood Loss: less than 50 mL         Drains: none         Total IV Fluids: per record          Specimens: Gallbladder           Complications: None; patient tolerated the procedure well.         Disposition: PACU - hemodynamically stable.         Condition: stable

## 2020-05-21 NOTE — Discharge Instructions (Signed)
[facility] ______[facility] °LAPAROSCOPIC SURGERY: POST OP INSTRUCTIONS °Always review your discharge instruction sheet given to you by the facility where your surgery was performed. °IF YOU HAVE DISABILITY OR FAMILY LEAVE FORMS, YOU MUST BRING THEM TO THE OFFICE FOR PROCESSING.   °DO NOT GIVE THEM TO YOUR DOCTOR. ° °1. A prescription for pain medication may be given to you upon discharge.  Take your pain medication as prescribed, if needed.  If narcotic pain medicine is not needed, then you may take acetaminophen (Tylenol) or ibuprofen (Advil) as needed. °2. Take your usually prescribed medications unless otherwise directed. °3. If you need a refill on your pain medication, please contact your pharmacy.  They will contact our office to request authorization. Prescriptions will not be filled after 5pm or on week-ends. °4. You should follow a light diet the first few days after arrival home, such as soup and crackers, etc.  Be sure to include lots of fluids daily. °5. Most patients will experience some swelling and bruising in the area of the incisions.  Ice packs will help.  Swelling and bruising can take several days to resolve.  °6. It is common to experience some constipation if taking pain medication after surgery.  Increasing fluid intake and taking a stool softener (such as Colace) will usually help or prevent this problem from occurring.  A mild laxative (Milk of Magnesia or Miralax) should be taken according to package instructions if there are no bowel movements after 48 hours. °7. Unless discharge instructions indicate otherwise, you may remove your bandages 24-48 hours after surgery, and you may shower at that time.  You may have steri-strips (small skin tapes) in place directly over the incision.  These strips should be left on the skin for 7-10 days.  If your surgeon used skin glue on the incision, you may shower in 24 hours.  The glue will flake off over the next 2-3 weeks.  Any sutures or  staples will be removed at the office during your follow-up visit. °8. ACTIVITIES:  You may resume regular (light) daily activities beginning the next day--such as daily self-care, walking, climbing stairs--gradually increasing activities as tolerated.  You may have sexual intercourse when it is comfortable.  Refrain from any heavy lifting or straining until approved by your doctor. °a. You may drive when you are no longer taking prescription pain medication, you can comfortably wear a seatbelt, and you can safely maneuver your car and apply brakes. °b. RETURN TO WORK:  __________________________________________________________ °9. You should see your doctor in the office for a follow-up appointment approximately 2-3 weeks after your surgery.  Make sure that you call for this appointment within a day or two after you arrive home to insure a convenient appointment time. °10. OTHER INSTRUCTIONS: __________________________________________________________________________________________________________________________ __________________________________________________________________________________________________________________________ °WHEN TO CALL YOUR DOCTOR: °1. Fever over 101.0 °2. Inability to urinate °3. Continued bleeding from incision. °4. Increased pain, redness, or drainage from the incision. °5. Increasing abdominal pain ° °The clinic staff is available to answer your questions during regular business hours.  Please don’t hesitate to call and ask to speak to one of the nurses for clinical concerns.  If you have a medical emergency, go to the nearest emergency room or call 911.  A surgeon from [facility] is always on call at the hospital. °[address], [address], [place], [place]  [address] ? [address] [address], [place], [place]   [address] °[phone] ? [phone] ? FAX [phone] °Web site:   [email or web address] °

## 2020-05-21 NOTE — Anesthesia Procedure Notes (Signed)
Procedure Name: Intubation Date/Time: 05/21/2020 9:56 AM Performed by: Myna Bright, CRNA Pre-anesthesia Checklist: Patient identified, Emergency Drugs available, Suction available and Patient being monitored Patient Re-evaluated:Patient Re-evaluated prior to induction Oxygen Delivery Method: Circle System Utilized Preoxygenation: Pre-oxygenation with 100% oxygen Induction Type: IV induction Ventilation: Mask ventilation without difficulty Laryngoscope Size: Mac and 3 Grade View: Grade I Tube type: Oral Tube size: 7.0 mm Number of attempts: 2 Airway Equipment and Method: Stylet and Oral airway Placement Confirmation: ETT inserted through vocal cords under direct vision,  positive ETCO2 and breath sounds checked- equal and bilateral Secured at: 24 cm Tube secured with: Tape Dental Injury: Teeth and Oropharynx as per pre-operative assessment

## 2020-05-21 NOTE — Interval H&P Note (Signed)
History and Physical Interval Note:  05/21/2020 9:09 AM  Kelsey Lucas  has presented today for surgery, with the diagnosis of gallstones.  The various methods of treatment have been discussed with the patient and family. After consideration of risks, benefits and other options for treatment, the patient has consented to  Procedure(s): LAPAROSCOPIC CHOLECYSTECTOMY (N/A) as a surgical intervention.  The patient's history has been reviewed, patient examined, no change in status, stable for surgery.  I have reviewed the patient's chart and labs.  Questions were answered to the patient's satisfaction.     Cleatis Fandrich A Jonesha Tsuchiya

## 2020-05-21 NOTE — Anesthesia Postprocedure Evaluation (Signed)
Anesthesia Post Note  Patient: Sopheap Basic  Procedure(s) Performed: LAPAROSCOPIC CHOLECYSTECTOMY (N/A Abdomen)     Patient location during evaluation: PACU Anesthesia Type: General Level of consciousness: awake and alert Pain management: pain level controlled Vital Signs Assessment: post-procedure vital signs reviewed and stable Respiratory status: spontaneous breathing, nonlabored ventilation, respiratory function stable and patient connected to nasal cannula oxygen Cardiovascular status: blood pressure returned to baseline and stable Postop Assessment: no apparent nausea or vomiting Anesthetic complications: no    Last Vitals:  Vitals:   05/21/20 1205 05/21/20 1220  BP: (!) 112/97 121/74  Pulse: 97 98  Resp: 20 20  Temp:  36.5 C  SpO2: 95% 97%    Last Pain:  Vitals:   05/21/20 1220  TempSrc:   PainSc: 3                  Trevor Iha

## 2020-05-22 LAB — SURGICAL PATHOLOGY

## 2021-03-13 IMAGING — CT CT RENAL STONE PROTOCOL
2 of 4 series · 16 of 46 positions shown, 18 images · non-contrast
Comparison: Chest radiographs 12/30/2019.

CLINICAL DATA: 28-year-old female with right side flank pain acute
onset this morning.

EXAM:
CT ABDOMEN AND PELVIS WITHOUT CONTRAST
TECHNIQUE: Multidetector CT imaging of the abdomen and pelvis was performed
following the standard protocol without IV contrast.

[Series 2: axial st · axial · 0.83mm/px · z∈[-503,-68]mm · 13 of 99 slices shown, 15 images]
[im 6/99  soft-tissue]
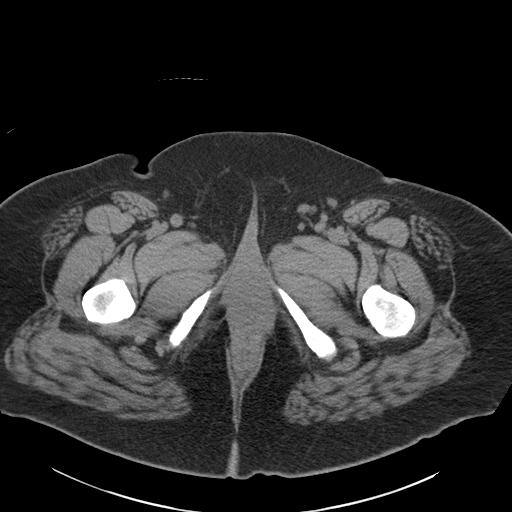
[im 6/99  bone]
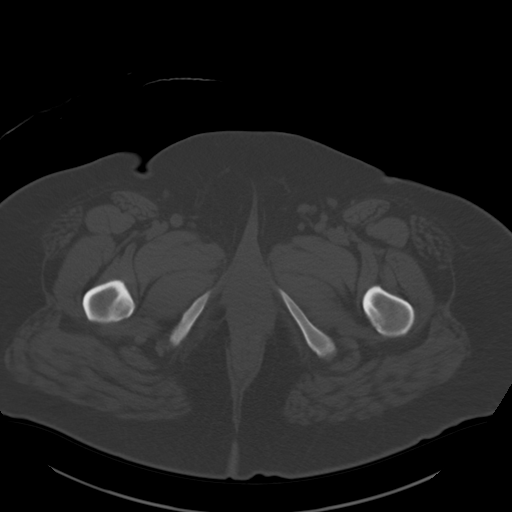
[im 16/99  soft-tissue]
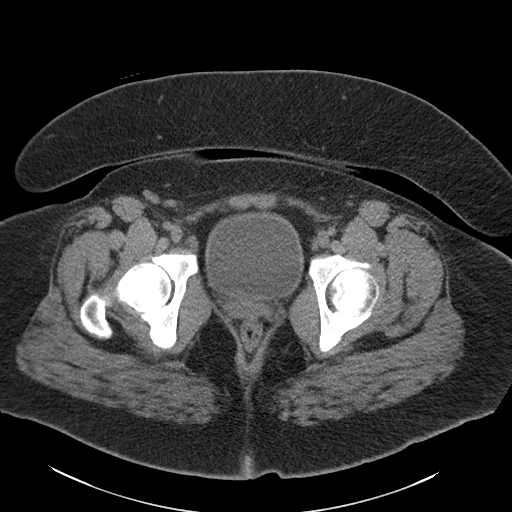
[im 21/99  soft-tissue]
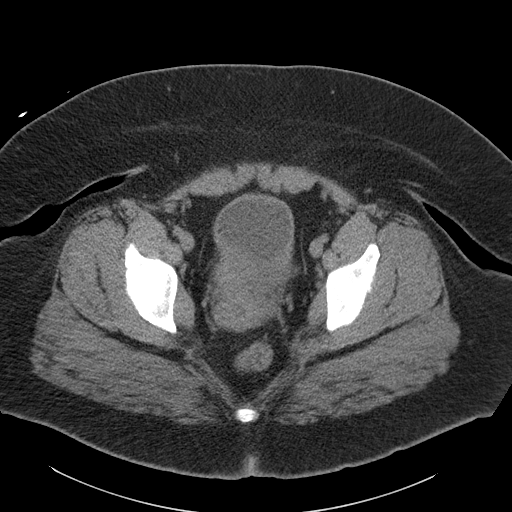
[im 26/99  soft-tissue]
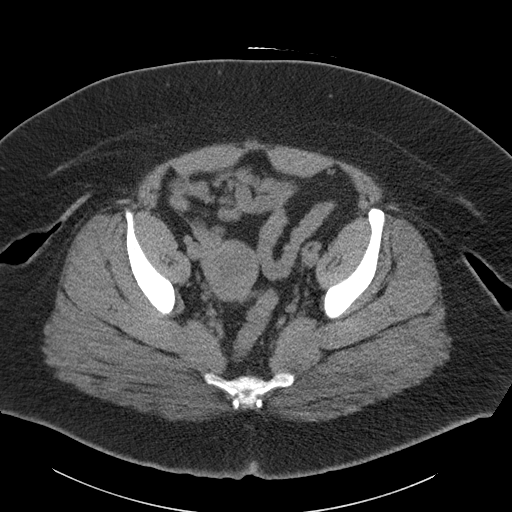
[im 37/99  soft-tissue]
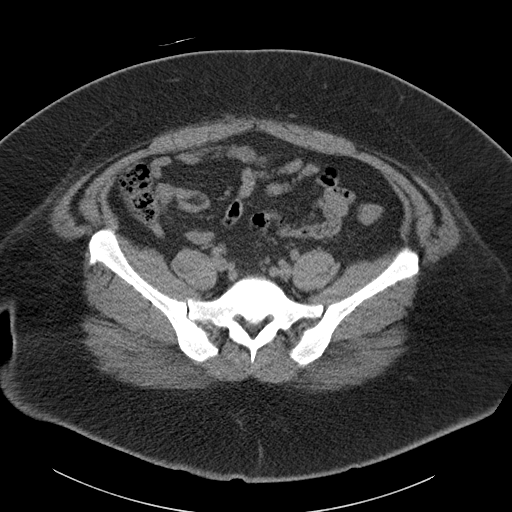
[im 42/99  soft-tissue]
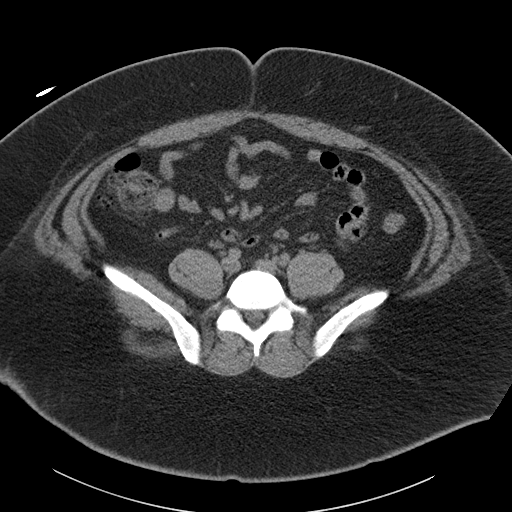
[im 52/99  soft-tissue]
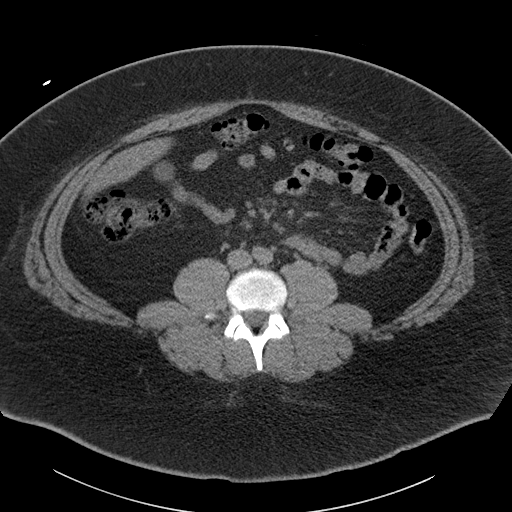
[im 57/99  soft-tissue]
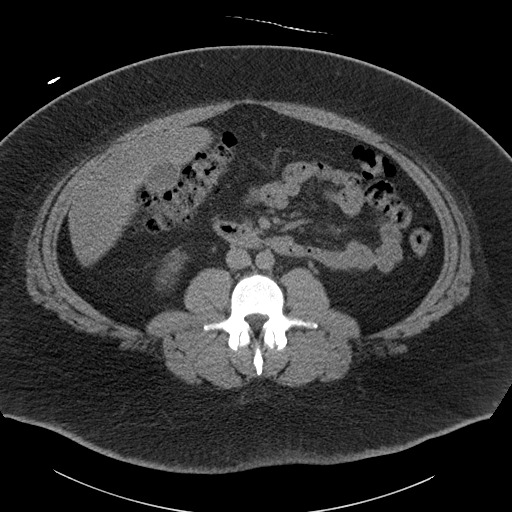
[im 62/99  soft-tissue]
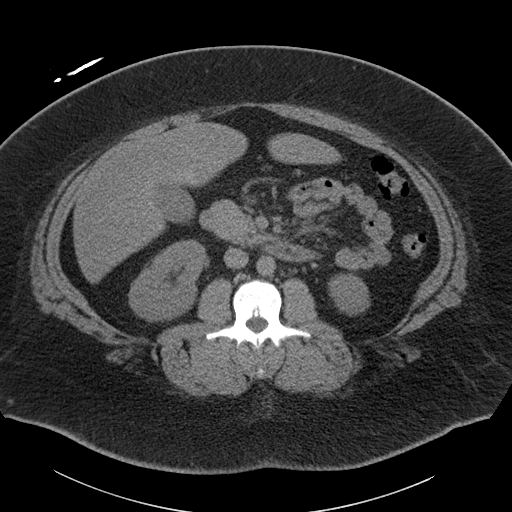
[im 62/99  bone]
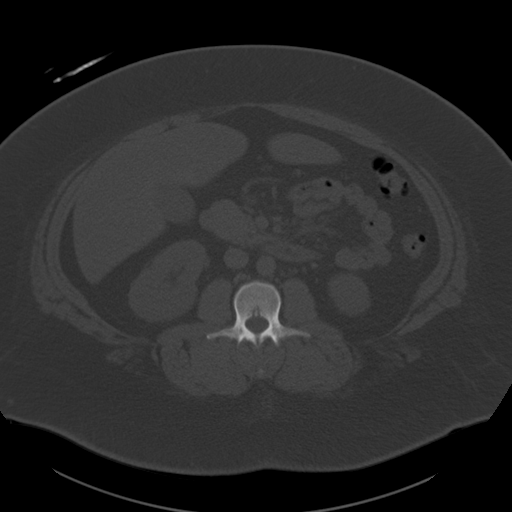
[im 73/99  soft-tissue]
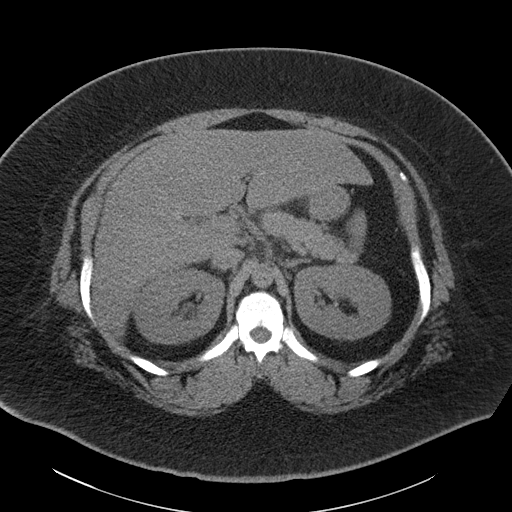
[im 78/99  soft-tissue]
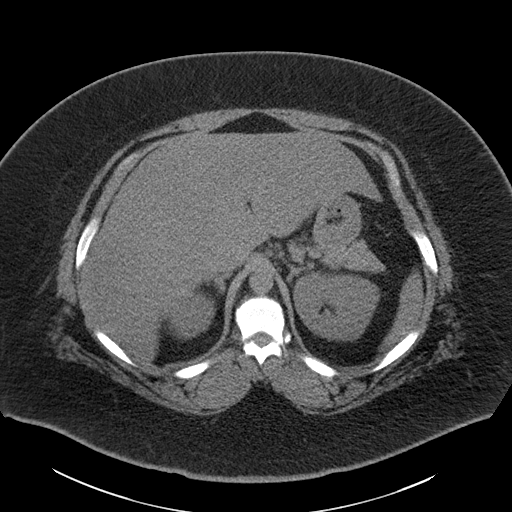
[im 83/99  soft-tissue]
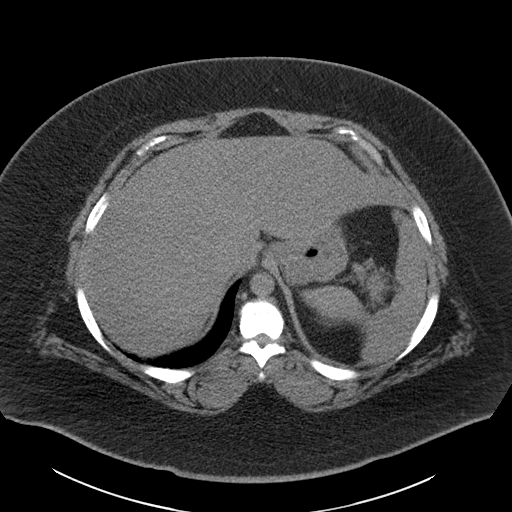
[im 93/99  soft-tissue]
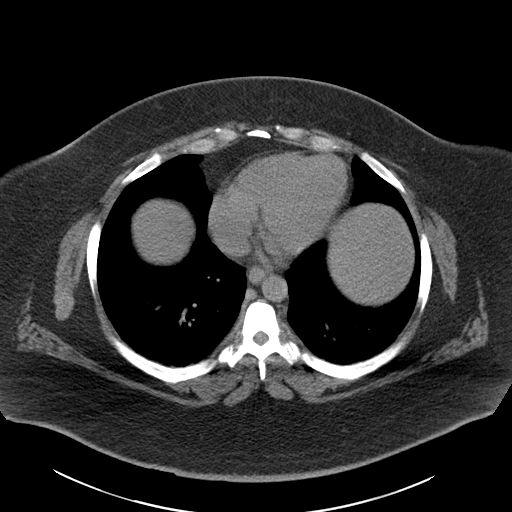

[Series 5: coronal · coronal · 0.81mm/px · 3 of 156 slices shown]
[im 52/156  soft-tissue]
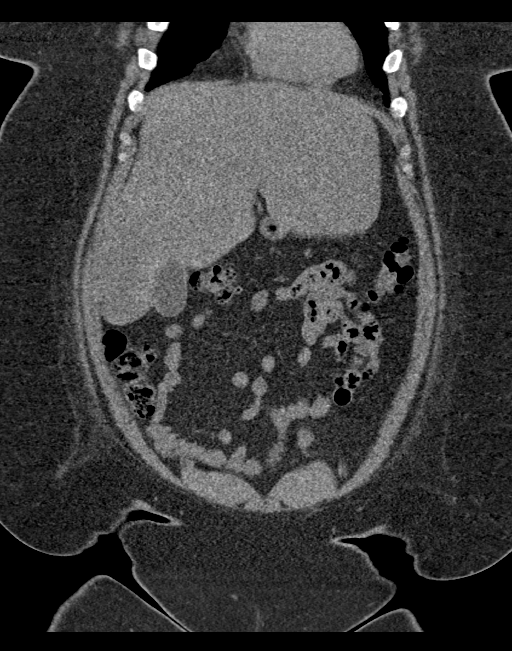
[im 69/156  soft-tissue]
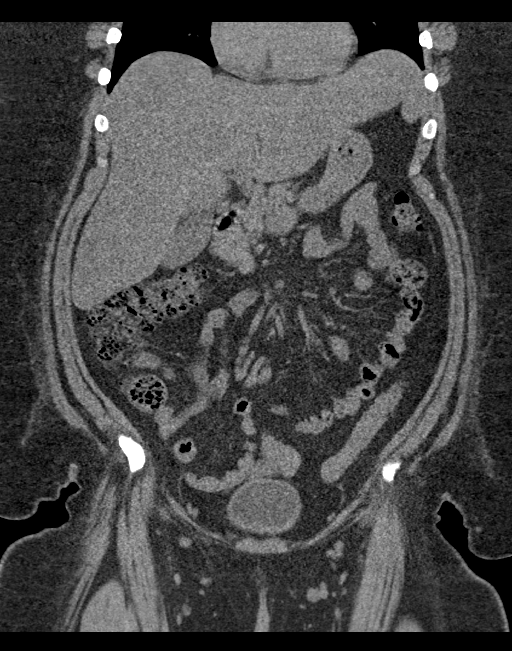
[im 87/156  soft-tissue]
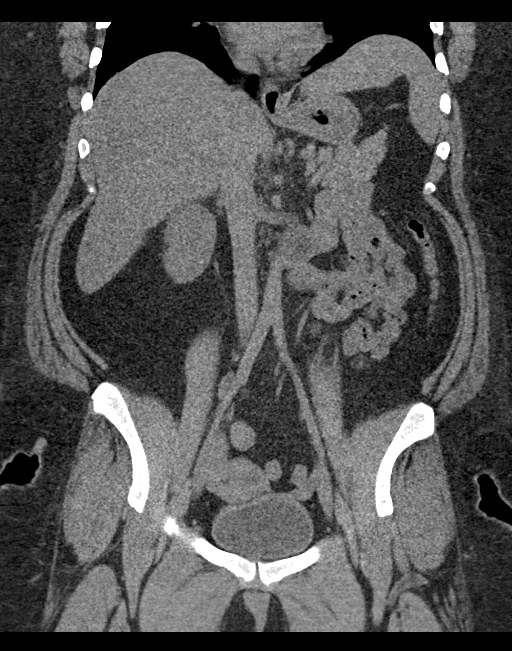

[16 of 46 positions shown; findings below may reference images not displayed]

FINDINGS: Lower chest: Negative aside from minor lung base atelectasis.

Hepatobiliary: Possible hepatic steatosis but otherwise negative
noncontrast liver and gallbladder.

Pancreas: Negative.

Spleen: Negative.

Adrenals/Urinary Tract: Normal adrenal glands.

Negative noncontrast left kidney and left ureter.

Diminutive and unremarkable urinary bladder.

No right hydronephrosis or hydroureter, and the ureter remains
normal to the bladder. A small right hemipelvic calcification on
series 2, image 86 is a phleboliths located below the right UVJ. No
urinary calculus identified.

Stomach/Bowel: Decompressed large and small bowel throughout the
abdomen. Elongated but normal appendix on coronal image 79. Negative
terminal ileum. No bowel inflammation is evident. Decompressed
stomach. No free air, free fluid.

Vascular/Lymphatic: Vascular patency is not evaluated in the absence
of IV contrast. No atherosclerosis identified. No lymphadenopathy.

Reproductive: Negative noncontrast appearance.

Other: No pelvic free fluid.

Musculoskeletal: Mild lower thoracic endplate spurring. Mild lumbar
facet hypertrophy. No acute osseous abnormality identified.
IMPRESSION: 1. No urinary calculus or obstructive uropathy. Normal appendix and
no acute or inflammatory process identified in the non-contrast
abdomen or pelvis.
2. Possible a patent steatosis.

## 2021-03-13 IMAGING — US US ABDOMEN LIMITED
1 series · 14 of 25 positions shown · non-contrast
Comparison: Abdomen and pelvis CT obtained earlier today.

CLINICAL DATA: Right upper quadrant abdominal pain since 4 a.m.
today. Morbid obesity.

EXAM:
ULTRASOUND ABDOMEN LIMITED RIGHT UPPER QUADRANT

[Series 1: us abdomen limited · 14 of 88 slices shown]
[im 1/88]
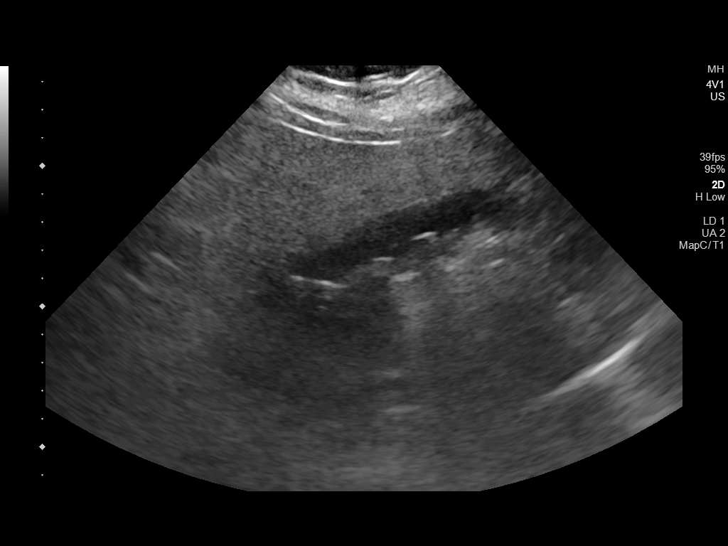
[im 8/88]
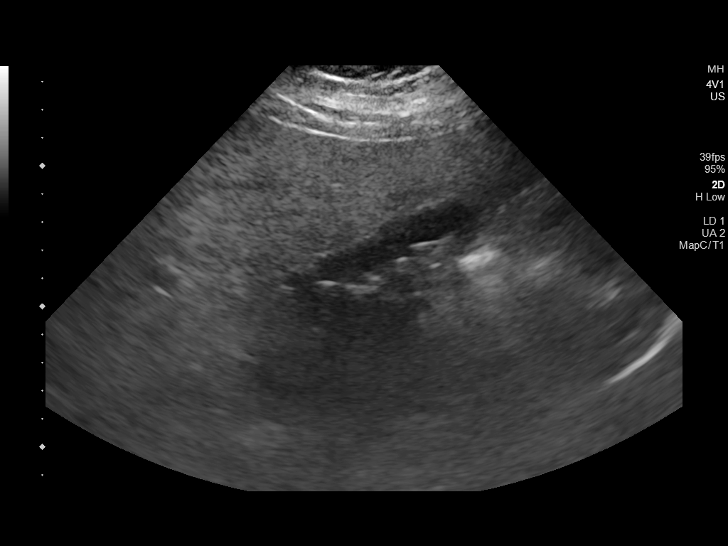
[im 15/88]
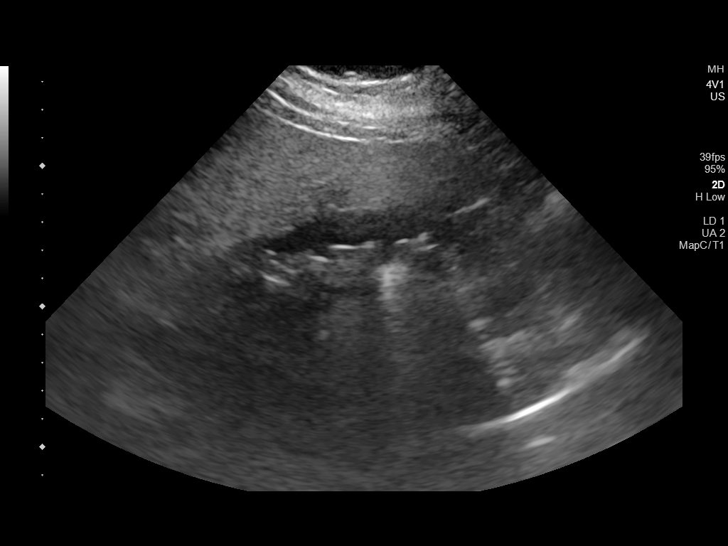
[im 22/88]
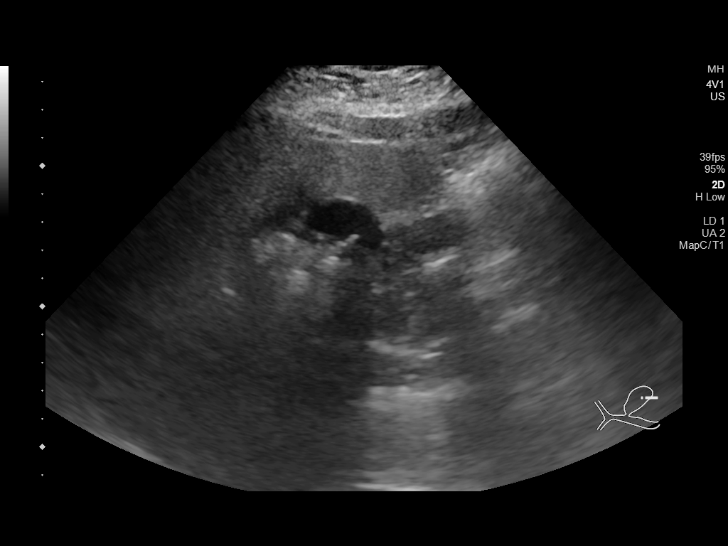
[im 30/88]
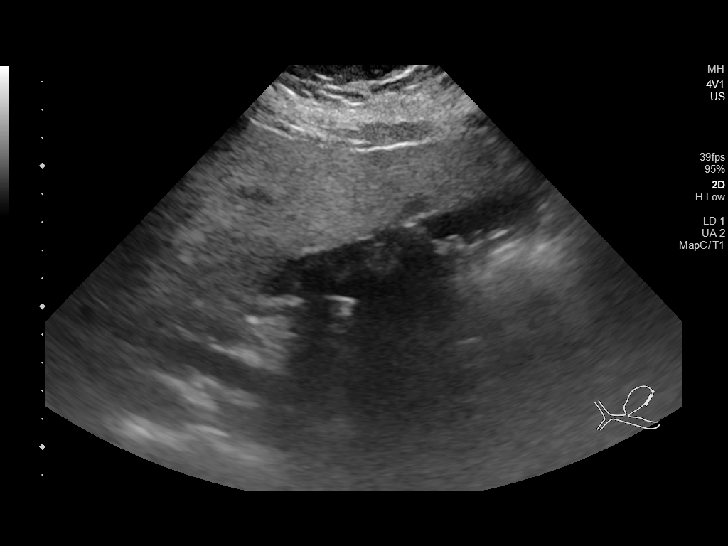
[im 33/88]
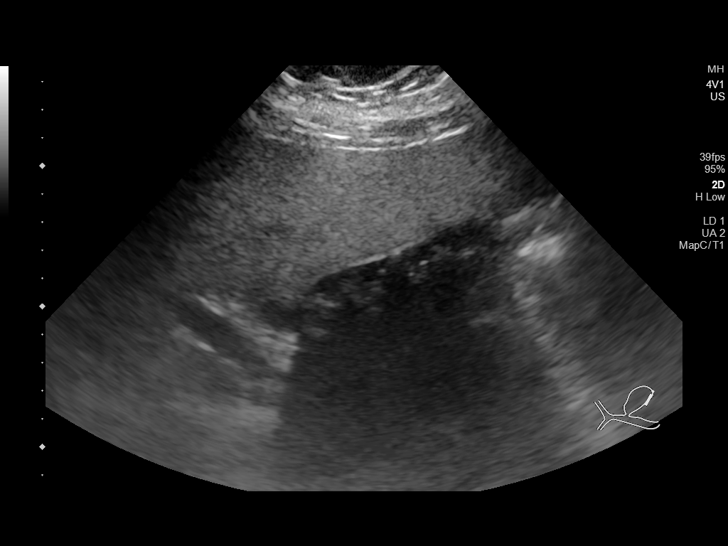
[im 40/88]
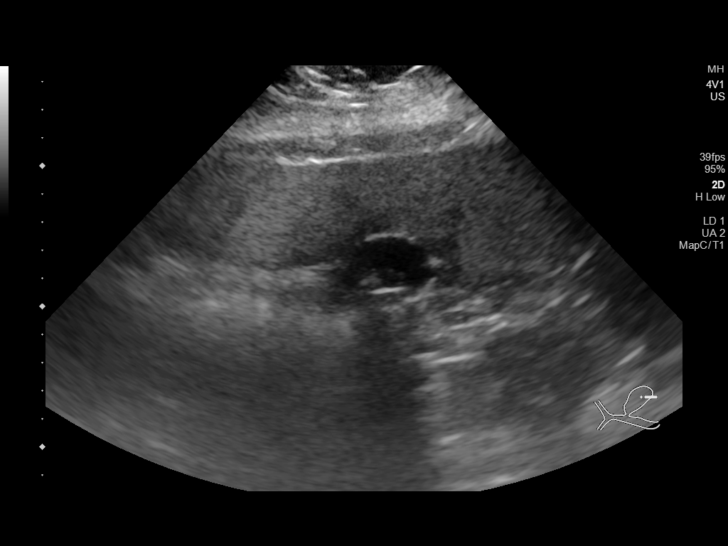
[im 48/88]
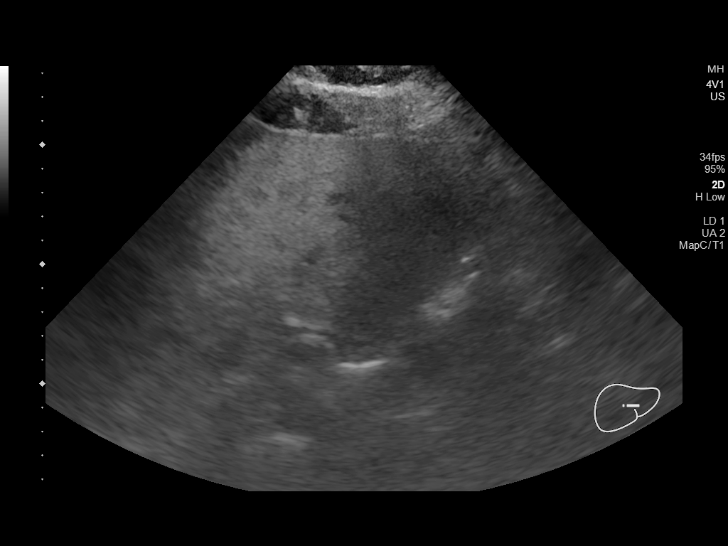
[im 55/88]
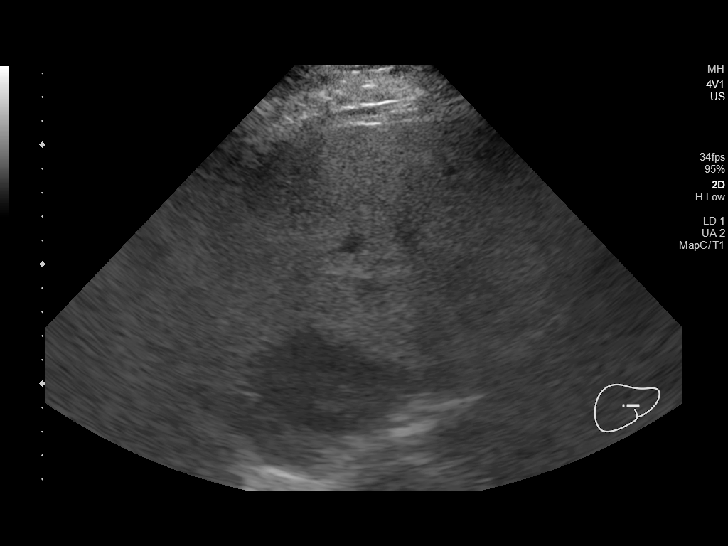
[im 59/88]
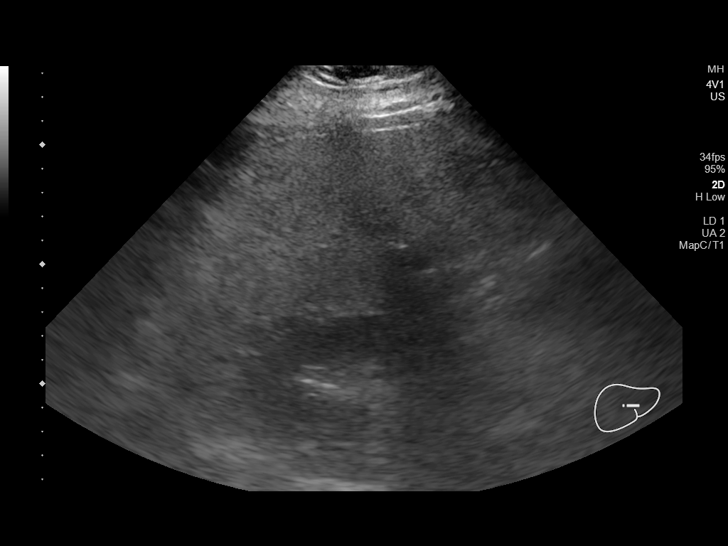
[im 66/88]
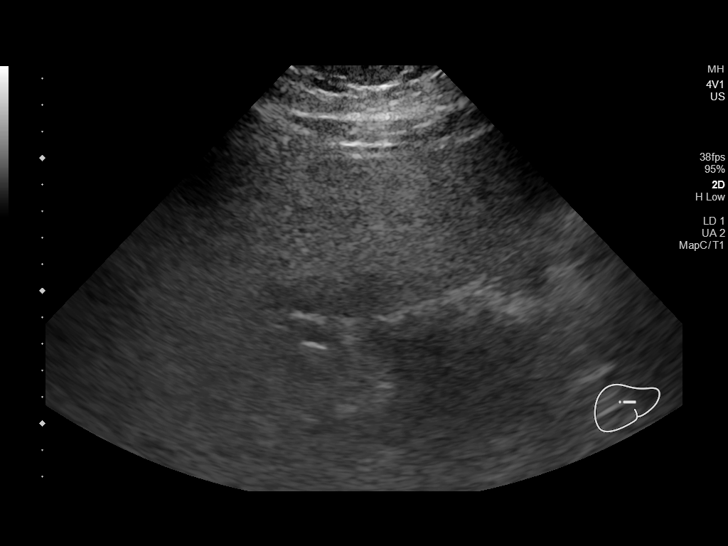
[im 73/88]
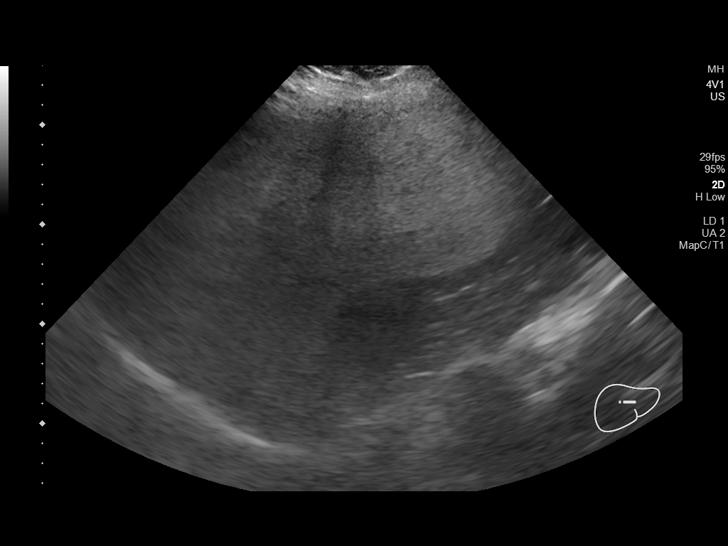
[im 80/88]
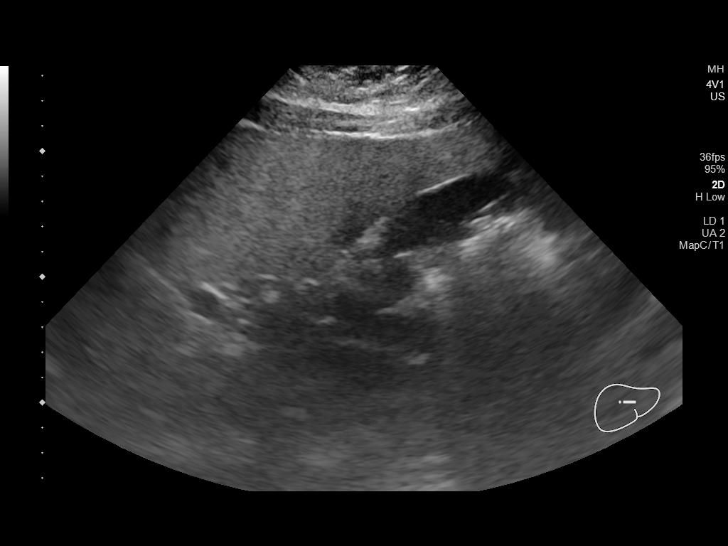
[im 88/88]
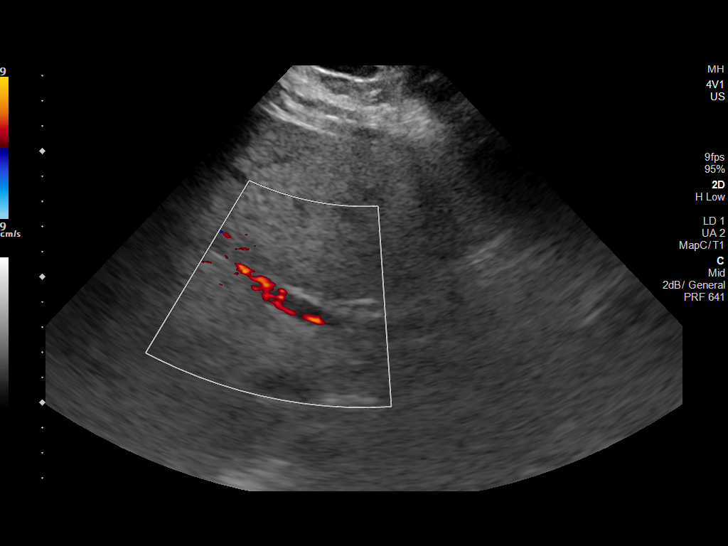

[14 of 25 positions shown; findings below may reference images not displayed]

FINDINGS: Gallbladder:

Large number of gallstones in the gallbladder measuring up to 8 mm
in maximum diameter each. No gallbladder wall thickening or
pericholecystic fluid. No sonographic Murphy sign.

Common bile duct:

Diameter: 0.4 mm

Liver:

Diffusely echogenic and mildly heterogeneous. Oval, relatively
hypoechoic areas adjacent to the gallbladder fossa. Corresponding
previously suspected hepatic steatosis with areas of sparing
adjacent to the gallbladder fossa. Portal vein is patent on color
Doppler imaging with normal direction of blood flow towards the
liver.

Other: None.
IMPRESSION: 1. Cholelithiasis without evidence of acute cholecystitis.
2. Diffuse hepatic steatosis with areas of sparing adjacent to the
gallbladder fossa.

## 2021-07-23 ENCOUNTER — Emergency Department (HOSPITAL_COMMUNITY)
Admission: EM | Admit: 2021-07-23 | Discharge: 2021-07-24 | Disposition: A | Discharge status: Home / Self Care | Payer: BLUE CROSS/BLUE SHIELD | Attending: Student | Admitting: Student

## 2021-07-23 ENCOUNTER — Encounter (HOSPITAL_COMMUNITY): Payer: Self-pay

## 2021-07-23 ENCOUNTER — Other Ambulatory Visit: Payer: Self-pay

## 2021-07-23 DIAGNOSIS — Z5321 Procedure and treatment not carried out due to patient leaving prior to being seen by health care provider: Secondary | ICD-10-CM | POA: Diagnosis not present

## 2021-07-23 DIAGNOSIS — J029 Acute pharyngitis, unspecified: Secondary | ICD-10-CM | POA: Insufficient documentation

## 2021-07-23 DIAGNOSIS — R519 Headache, unspecified: Secondary | ICD-10-CM | POA: Insufficient documentation

## 2021-07-23 DIAGNOSIS — R110 Nausea: Secondary | ICD-10-CM | POA: Insufficient documentation

## 2021-07-23 NOTE — ED Provider Notes (Signed)
Emergency Medicine Provider Triage Evaluation Note  Kelsey Lucas , a 30 y.o. female  was evaluated in triage.  Pt complains of burning sensation in throat for the last day. No difficulty swallowing. Denies any abdominal pain. No vomiting. Also admits to nausea. Also admits to HA for the last day, no sudden onset. No history of reflux or migraines.   Review of Systems  Positive: Migraines, nausea, reflux Negative: Abdominal pain  Physical Exam  BP (!) 173/96 (BP Location: Left Arm)   Pulse (!) 102   Temp 98 F (36.7 C) (Oral)   Resp 16   SpO2 99%  Gen:   Awake, no distress   Resp:  Normal effort  MSK:   Moves extremities without difficulty  Other:    Medical Decision Making  Medically screening exam initiated at 7:13 PM.  Appropriate orders placed.  [name] was informed that the remainder of the evaluation will be completed by another provider, this initial triage assessment does not replace that evaluation, and the importance of remaining in the ED until their evaluation is complete.     Farrel Gordon, PA-C 07/23/21 1916    Alvira Monday, MD 07/25/21 2322

## 2021-07-23 NOTE — ED Notes (Signed)
Unsuccessful blood draw attempt. 

## 2021-07-23 NOTE — ED Triage Notes (Signed)
Burning sensation to throat accompanied with headache and nausea x1 day.

## 2021-09-07 ENCOUNTER — Other Ambulatory Visit: Payer: Self-pay

## 2021-09-07 ENCOUNTER — Encounter (HOSPITAL_BASED_OUTPATIENT_CLINIC_OR_DEPARTMENT_OTHER): Payer: Self-pay

## 2021-09-07 ENCOUNTER — Emergency Department (HOSPITAL_BASED_OUTPATIENT_CLINIC_OR_DEPARTMENT_OTHER): Payer: BLUE CROSS/BLUE SHIELD

## 2021-09-07 DIAGNOSIS — M25561 Pain in right knee: Secondary | ICD-10-CM | POA: Diagnosis not present

## 2021-09-07 LAB — PREGNANCY, URINE: Preg Test, Ur: NEGATIVE

## 2021-09-07 NOTE — ED Triage Notes (Signed)
Pt complains of R knee pain and swelling. Pt denies injury. Pt ambulatory in triage.

## 2021-09-08 ENCOUNTER — Ambulatory Visit: Payer: Self-pay

## 2021-09-08 ENCOUNTER — Other Ambulatory Visit: Payer: Self-pay

## 2021-09-08 ENCOUNTER — Ambulatory Visit (INDEPENDENT_AMBULATORY_CARE_PROVIDER_SITE_OTHER): Payer: BLUE CROSS/BLUE SHIELD | Admitting: Family Medicine

## 2021-09-08 ENCOUNTER — Encounter: Payer: Self-pay | Admitting: Family Medicine

## 2021-09-08 ENCOUNTER — Emergency Department (HOSPITAL_BASED_OUTPATIENT_CLINIC_OR_DEPARTMENT_OTHER)
Admission: EM | Admit: 2021-09-08 | Discharge: 2021-09-08 | Disposition: A | Discharge status: Home / Self Care | Payer: BLUE CROSS/BLUE SHIELD | Attending: Emergency Medicine | Admitting: Emergency Medicine

## 2021-09-08 VITALS — Ht 65.0 in | Wt 320.0 lb

## 2021-09-08 DIAGNOSIS — M1711 Unilateral primary osteoarthritis, right knee: Secondary | ICD-10-CM | POA: Insufficient documentation

## 2021-09-08 DIAGNOSIS — M25561 Pain in right knee: Secondary | ICD-10-CM

## 2021-09-08 MED ORDER — PREDNISONE 5 MG PO TABS: ORAL_TABLET | ORAL | 0 refills | Status: AC

## 2021-09-08 MED ORDER — DICLOFENAC SODIUM 1 % EX GEL: 2.0000 g | Freq: Four times a day (QID) | CUTANEOUS | 0 refills | Status: AC | PRN

## 2021-09-08 NOTE — Assessment & Plan Note (Signed)
Acutely occurring.  Symptoms most consistent with the degenerative changes appreciated. -Counseled on home exercise therapy and supportive therapy -Prednisone. -Could consider injection or physical therapy.

## 2021-09-08 NOTE — Progress Notes (Signed)
  Kelsey Lucas - 30 y.o. female MRN [id number]  Date of birth: March 13, 1991  SUBJECTIVE:  Including CC & ROS.  No chief complaint on file.   Kelsey Lucas is a 30 y.o. female that is presenting with acute left knee pain.  The pain is lateral in nature.  No injury or inciting event.  Worse with standing or weightbearing.  Independent review of the right knee x-ray from 9/20 shows   Review of Systems See HPI   HISTORY: Past Medical, Surgical, Social, and Family History Reviewed & Updated per EMR.   Pertinent Historical Findings include:  Past Medical History:  Diagnosis Date   PCOS (polycystic ovarian syndrome)    Pre-diabetes     Past Surgical History:  Procedure Laterality Date   CHOLECYSTECTOMY N/A 05/21/2020   Procedure: LAPAROSCOPIC CHOLECYSTECTOMY;  Surgeon: Harriette Bouillon, MD;  Location: [facility] OR;  Service: General;  Laterality: N/A;    Family History  Problem Relation Age of Onset   Hypertension Mother    Diabetes Mother    Cancer Father     Social History   Socioeconomic History   Marital status: Single    Spouse name: Not on file   Number of children: Not on file   Years of education: Not on file   Highest education level: Not on file  Occupational History   Not on file  Tobacco Use   Smoking status: Never   Smokeless tobacco: Never  Vaping Use   Vaping Use: Never used  Substance and Sexual Activity   Alcohol use: Never   Drug use: Never   Sexual activity: Not on file  Other Topics Concern   Not on file  Social History Narrative   Not on file   Social Determinants of Health   Financial Resource Strain: Not on file  Food Insecurity: Not on file  Transportation Needs: Not on file  Physical Activity: Not on file  Stress: Not on file  Social Connections: Not on file  Intimate Partner Violence: Not on file     PHYSICAL EXAM:  VS: Ht 5\' 5"  (1.651 m)   Wt (!) 320 lb (145.2 kg)   LMP 08/29/2021 (Approximate)   BMI 53.25 kg/m  Physical  Exam Gen: NAD, alert, cooperative with exam, well-appearing   Limited ultrasound: Left knee:  Mild effusion within the suprapatellar pouch but is observed laterally over the femoral condyle. Mild spurring at the insertion of the quadricep tendon. Mild medial joint space narrowing. Moderate lateral joint space narrowing  Summary: Degenerative changes and effusion appreciated  Ultrasound and interpretation by 10/29/2021, MD     ASSESSMENT & PLAN:   Primary osteoarthritis of right knee Acutely occurring.  Symptoms most consistent with the degenerative changes appreciated. -Counseled on home exercise therapy and supportive therapy -Prednisone. -Could consider injection or physical therapy.

## 2021-09-08 NOTE — Discharge Instructions (Signed)

## 2021-09-08 NOTE — Patient Instructions (Addendum)
Nice to meet you Please try ice as needed  Please try the exercises   Please send me a message in MyChart with any questions or updates.  Please see me back in 4 weeks.   --Dr. Shiane Wenberg  

## 2021-09-08 NOTE — ED Notes (Signed)
Patient left ED with ABCs intact, alert and oriented x4, respirations even and unlabored. Discharge instructions reviewed and all questions answered.   

## 2021-09-08 NOTE — ED Provider Notes (Signed)
Emergency Department Provider Note   I have reviewed the triage vital signs and the nursing notes.   HISTORY  Chief Complaint Knee Pain   HPI Kelsey Lucas is a 30 y.o. female with PMH reviewed below presents to the ED atraumatic right knee pain/swelling. Patient reports "months" of pain in the right knee but pain is worsening over the last week. No injury to cause pain to worsen. No fever, knee redness, or warmth. Friend at bedside notes that he can feel a crunching with bending the knee. Patient denies any numbness. No hip or ankle pain.    Past Medical History:  Diagnosis Date   PCOS (polycystic ovarian syndrome)    Pre-diabetes     There are no problems to display for this patient.   Past Surgical History:  Procedure Laterality Date   CHOLECYSTECTOMY N/A 05/21/2020   Procedure: LAPAROSCOPIC CHOLECYSTECTOMY;  Surgeon: Harriette Bouillon, MD;  Location: [facility] OR;  Service: General;  Laterality: N/A;    Allergies Ibuprofen  Family History  Problem Relation Age of Onset   Hypertension Mother    Diabetes Mother    Cancer Father     Social History Social History   Tobacco Use   Smoking status: Never   Smokeless tobacco: Never  Vaping Use   Vaping Use: Never used  Substance Use Topics   Alcohol use: Never   Drug use: Never    Review of Systems  Constitutional: No fever/chills Musculoskeletal: Negative for back pain. Positive right knee pain.  Skin: Negative for rash. Neurological: Negative for numbness.   ____________________________________________   PHYSICAL EXAM:  VITAL SIGNS: ED Triage Vitals  Enc Vitals Group     BP 09/07/21 2245 (!) 149/95     Pulse Rate 09/07/21 2245 (!) 111     Resp 09/07/21 2245 17     Temp 09/07/21 2245 98.3 F (36.8 C)     Temp Source 09/07/21 2245 Oral     SpO2 09/07/21 2245 97 %     Weight 09/07/21 2237 (!) 320 lb (145.2 kg)     Height 09/07/21 2237 5\' 5"  (1.651 m)   Constitutional: Alert and oriented. Well  appearing and in no acute distress. Eyes: Conjunctivae are normal.  Head: Atraumatic. Nose: No congestion/rhinnorhea. Mouth/Throat: Mucous membranes are moist.   Neck: No stridor.   Cardiovascular: Normal rate, regular rhythm. Good peripheral circulation. Grossly normal heart sounds.   Respiratory: Normal respiratory effort.  No retractions. Lungs CTAB. Gastrointestinal: Soft and nontender. No distention.  Musculoskeletal: No right knee erythema/warmth. Normal ROM. No joint laxity. No calf redness/tenderness. No ankle tenderness.  Neurologic:  Normal speech and language.  Skin:  Skin is warm, dry and intact. No rash noted.   ____________________________________________   LABS (all labs ordered are listed, but only abnormal results are displayed)  Labs Reviewed  PREGNANCY, URINE   ____________________________________________  RADIOLOGY  DG Knee Complete 4 Views Right  Result Date: 09/07/2021 CLINICAL DATA:  Knee pain, no known injury, initial encounter EXAM: RIGHT KNEE - COMPLETE 4+ VIEW COMPARISON:  None. FINDINGS: Tricompartmental degenerative changes are noted. No acute fracture or dislocation is seen. No soft tissue abnormality is noted. IMPRESSION: Degenerative change without acute abnormality. Electronically Signed   By: 09/09/2021 M.D.   On: 09/07/2021 23:06    ____________________________________________   PROCEDURES  Procedure(s) performed:   Procedures  None  ____________________________________________   INITIAL IMPRESSION / ASSESSMENT AND PLAN / ED COURSE  Pertinent labs & imaging results that were available  during my care of the patient were reviewed by me and considered in my medical decision making (see chart for details).   Patient presents emergency department with atraumatic right knee pain.  No severe swelling or large effusion noted clinically.  The exam is not consistent with septic joint or gout.  Plain film shows some degenerative change without  acute abnormality such as fracture.  No history to suspect dislocation/relocation of the joint.  Plan for RICE and sports med follow up. Discussed ED return precautions.    ____________________________________________  FINAL CLINICAL IMPRESSION(S) / ED DIAGNOSES  Final diagnoses:  Acute pain of right knee    NEW OUTPATIENT MEDICATIONS STARTED DURING THIS VISIT:  Discharge Medication List as of 09/08/2021 12:47 AM     START taking these medications   Details  diclofenac Sodium (VOLTAREN) 1 % GEL Apply 2 g topically 4 (four) times daily as needed., Starting Wed 09/08/2021, Normal        Note:  This document was prepared using Dragon voice recognition software and may include unintentional dictation errors.  Alona Bene, MD, [facility] Emergency Medicine    Maniya Donovan, Arlyss Repress, MD 09/08/21 [phone]

## 2021-10-08 ENCOUNTER — Ambulatory Visit: Payer: BLUE CROSS/BLUE SHIELD | Admitting: Family Medicine

## 2021-10-08 NOTE — Progress Notes (Deleted)
  Kelsey Lucas - 30 y.o. female MRN [id number]  Date of birth: 21-Jul-1991  SUBJECTIVE:  Including CC & ROS.  No chief complaint on file.   Kelsey Lucas is a 30 y.o. female that is  ***.  ***   Review of Systems See HPI   HISTORY: Past Medical, Surgical, Social, and Family History Reviewed & Updated per EMR.   Pertinent Historical Findings include:  Past Medical History:  Diagnosis Date   PCOS (polycystic ovarian syndrome)    Pre-diabetes     Past Surgical History:  Procedure Laterality Date   CHOLECYSTECTOMY N/A 05/21/2020   Procedure: LAPAROSCOPIC CHOLECYSTECTOMY;  Surgeon: Harriette Bouillon, MD;  Location: [facility] OR;  Service: General;  Laterality: N/A;    Family History  Problem Relation Age of Onset   Hypertension Mother    Diabetes Mother    Cancer Father     Social History   Socioeconomic History   Marital status: Single    Spouse name: Not on file   Number of children: Not on file   Years of education: Not on file   Highest education level: Not on file  Occupational History   Not on file  Tobacco Use   Smoking status: Never   Smokeless tobacco: Never  Vaping Use   Vaping Use: Never used  Substance and Sexual Activity   Alcohol use: Never   Drug use: Never   Sexual activity: Not on file  Other Topics Concern   Not on file  Social History Narrative   Not on file   Social Determinants of Health   Financial Resource Strain: Not on file  Food Insecurity: Not on file  Transportation Needs: Not on file  Physical Activity: Not on file  Stress: Not on file  Social Connections: Not on file  Intimate Partner Violence: Not on file     PHYSICAL EXAM:  VS: There were no vitals taken for this visit. Physical Exam Gen: NAD, alert, cooperative with exam, well-appearing MSK:  ***      ASSESSMENT & PLAN:   No problem-specific Assessment & Plan notes found for this encounter.

## 2021-12-04 ENCOUNTER — Other Ambulatory Visit: Payer: Self-pay

## 2021-12-04 ENCOUNTER — Emergency Department (HOSPITAL_BASED_OUTPATIENT_CLINIC_OR_DEPARTMENT_OTHER)
Admission: EM | Admit: 2021-12-04 | Discharge: 2021-12-05 | Disposition: A | Discharge status: Home / Self Care | Payer: BLUE CROSS/BLUE SHIELD | Attending: Emergency Medicine | Admitting: Emergency Medicine

## 2021-12-04 ENCOUNTER — Encounter (HOSPITAL_BASED_OUTPATIENT_CLINIC_OR_DEPARTMENT_OTHER): Payer: Self-pay | Admitting: Radiology

## 2021-12-04 DIAGNOSIS — K859 Acute pancreatitis without necrosis or infection, unspecified: Secondary | ICD-10-CM | POA: Insufficient documentation

## 2021-12-04 DIAGNOSIS — R1013 Epigastric pain: Secondary | ICD-10-CM | POA: Diagnosis present

## 2021-12-04 DIAGNOSIS — R197 Diarrhea, unspecified: Secondary | ICD-10-CM

## 2021-12-04 LAB — COMPREHENSIVE METABOLIC PANEL
ALT: 80 U/L — ABNORMAL HIGH (ref 0–44)
AST: 77 U/L — ABNORMAL HIGH (ref 15–41)
Albumin: 4.3 g/dL (ref 3.5–5.0)
Alkaline Phosphatase: 83 U/L (ref 38–126)
Anion gap: 7 (ref 5–15)
BUN: 9 mg/dL (ref 6–20)
CO2: 32 mmol/L (ref 22–32)
Calcium: 9.4 mg/dL (ref 8.9–10.3)
Chloride: 98 mmol/L (ref 98–111)
Creatinine, Ser: 0.66 mg/dL (ref 0.44–1.00)
GFR, Estimated: >60 mL/min (ref >60)
Glucose, Bld: 230 mg/dL — ABNORMAL HIGH (ref 70–99)
Potassium: 4.1 mmol/L (ref 3.5–5.1)
Sodium: 137 mmol/L (ref 135–145)
Total Bilirubin: 0.7 mg/dL (ref 0.3–1.2)
Total Protein: 8 g/dL (ref 6.5–8.1)

## 2021-12-04 LAB — CBC WITH DIFFERENTIAL/PLATELET
Abs Immature Granulocytes: 0.02 10*3/uL (ref 0.00–0.07)
Basophils Absolute: 0.1 10*3/uL (ref 0.0–0.1)
Basophils Relative: 1 %
Eosinophils Absolute: 0.2 10*3/uL (ref 0.0–0.5)
Eosinophils Relative: 2 %
HCT: 43.3 % (ref 36.0–46.0)
Hemoglobin: 13.5 g/dL (ref 12.0–15.0)
Immature Granulocytes: 0 %
Lymphocytes Relative: 32 %
Lymphs Abs: 2.1 10*3/uL (ref 0.7–4.0)
MCH: 24.7 pg — ABNORMAL LOW (ref 26.0–34.0)
MCHC: 31.2 g/dL (ref 30.0–36.0)
MCV: 79.3 fL — ABNORMAL LOW (ref 80.0–100.0)
Monocytes Absolute: 0.7 10*3/uL (ref 0.1–1.0)
Monocytes Relative: 10 %
Neutro Abs: 3.7 10*3/uL (ref 1.7–7.7)
Neutrophils Relative %: 55 %
Platelets: 309 10*3/uL (ref 150–400)
RBC: 5.46 MIL/uL — ABNORMAL HIGH (ref 3.87–5.11)
RDW: 14.7 % (ref 11.5–15.5)
WBC: 6.7 10*3/uL (ref 4.0–10.5)
nRBC: 0 % (ref 0.0–0.2)

## 2021-12-04 LAB — LIPASE, BLOOD: Lipase: 112 U/L — ABNORMAL HIGH (ref 11–51)

## 2021-12-04 MED ORDER — SODIUM CHLORIDE 0.9 % IV BOLUS
1000.0000 mL | Freq: Once | INTRAVENOUS | Status: AC
  Administered 2021-12-04: 1000 mL via INTRAVENOUS

## 2021-12-04 MED ORDER — ACETAMINOPHEN 325 MG PO TABS
650.0000 mg | ORAL_TABLET | Freq: Once | ORAL | Status: AC
  Administered 2021-12-04: 650 mg via ORAL
  Filled 2021-12-04: qty 2

## 2021-12-04 NOTE — ED Triage Notes (Signed)
Pt states she has had  nausea, diarrhea and sharp abd pain for the past week. Nothing taken at home for relief.

## 2021-12-05 ENCOUNTER — Emergency Department (HOSPITAL_BASED_OUTPATIENT_CLINIC_OR_DEPARTMENT_OTHER): Payer: BLUE CROSS/BLUE SHIELD

## 2021-12-05 LAB — URINALYSIS, ROUTINE W REFLEX MICROSCOPIC
Bilirubin Urine: NEGATIVE
Glucose, UA: NEGATIVE mg/dL
Hgb urine dipstick: NEGATIVE
Nitrite: NEGATIVE
Protein, ur: 30 mg/dL — AB
Specific Gravity, Urine: 1.033 — ABNORMAL HIGH (ref 1.005–1.030)
pH: 5.5 (ref 5.0–8.0)

## 2021-12-05 LAB — PREGNANCY, URINE: Preg Test, Ur: NEGATIVE

## 2021-12-05 MED ORDER — IOHEXOL 300 MG/ML  SOLN
100.0000 mL | Freq: Once | INTRAMUSCULAR | Status: AC | PRN
  Administered 2021-12-05: 02:00:00 100 mL via INTRAVENOUS

## 2021-12-05 NOTE — Discharge Instructions (Addendum)
You can try over-the-counter medications for diarrhea such as Imodium.  Continue taking Tylenol as needed for any pain.  I advised a liquid diet for the next 48 hours with gradual advancement to a normal diet.  Call your primary care doctor or specialist as discussed in the next 2-3 days.   Return immediately back to the ER if:  Your symptoms worsen within the next 12-24 hours. You develop new symptoms such as new fevers, persistent vomiting, new pain, shortness of breath, or new weakness or numbness, or if you have any other concerns.

## 2021-12-05 NOTE — ED Provider Notes (Signed)
[facility] EMERGENCY DEPT Provider Note   CSN: [id number] Arrival date & time: 12/04/21  2246     History Chief Complaint  Patient presents with   Abdominal Pain   Diarrhea    Kelsey Lucas is a 30 y.o. female.  Patient presents chief complaint of epigastric pain described as sharp and aching persistent over the past week.  Associated with multiple episodes of diarrhea per day nonbloody per patient.  Otherwise denies any recent travel or recent antibiotic use.  She denies any vomiting.      Past Medical History:  Diagnosis Date   PCOS (polycystic ovarian syndrome)    Pre-diabetes     Patient Active Problem List   Diagnosis Date Noted   Primary osteoarthritis of right knee 09/08/2021    Past Surgical History:  Procedure Laterality Date   CHOLECYSTECTOMY N/A 05/21/2020   Procedure: LAPAROSCOPIC CHOLECYSTECTOMY;  Surgeon: Harriette Bouillon, MD;  Location: [facility] OR;  Service: General;  Laterality: N/A;     OB History   No obstetric history on file.     Family History  Problem Relation Age of Onset   Hypertension Mother    Diabetes Mother    Cancer Father     Social History   Tobacco Use   Smoking status: Never   Smokeless tobacco: Never  Vaping Use   Vaping Use: Never used  Substance Use Topics   Alcohol use: Never   Drug use: Never    Home Medications Prior to Admission medications   Medication Sig Start Date End Date Taking? Authorizing Provider  diclofenac Sodium (VOLTAREN) 1 % GEL Apply 2 g topically 4 (four) times daily as needed. 09/08/21   Long, Arlyss Repress, MD  ergocalciferol (VITAMIN D2) 1.25 MG (50000 UT) capsule Take 50,000 Units by mouth once a week. Friday    [provider]  medroxyPROGESTERone (PROVERA) 10 MG tablet Take 10 mg by mouth daily.    [provider]  metFORMIN (GLUCOPHAGE) 500 MG tablet Take 500 mg by mouth 2 (two) times daily with a meal.    [provider]  norgestrel-ethinyl estradiol  (OGESTROL) 0.5-50 MG-MCG tablet Take 1 tablet by mouth daily.    [provider]  ondansetron (ZOFRAN ODT) 4 MG disintegrating tablet 4mg  ODT q4 hours prn nausea/vomit Patient taking differently: Take 4 mg by mouth every 8 (eight) hours as needed for nausea or vomiting.  03/23/20   05/23/20, PA-C  oxyCODONE (OXY IR/ROXICODONE) 5 MG immediate release tablet Take 1 tablet (5 mg total) by mouth every 6 (six) hours as needed for severe pain. 05/21/20   Cornett, 07/21/20, MD  predniSONE (DELTASONE) 5 MG tablet Take 6 pills for first day, 5 pills second day, 4 pills third day, 3 pills fourth day, 2 pills the fifth day, and 1 pill sixth day. 09/08/21   09/10/21, MD    Allergies    Ibuprofen  Review of Systems   Review of Systems  Constitutional:  Negative for fever.  HENT:  Negative for ear pain.   Eyes:  Negative for pain.  Respiratory:  Negative for cough.   Cardiovascular:  Negative for chest pain.  Gastrointestinal:  Positive for abdominal pain.  Genitourinary:  Negative for flank pain.  Musculoskeletal:  Negative for back pain.  Skin:  Negative for rash.  Neurological:  Negative for headaches.   Physical Exam Updated Vital Signs BP (!) 102/56    Pulse 94    Temp 98.2 F (36.8 C)  Resp 16    Ht 5\' 5"  (1.651 m)    Wt (!) 138.3 kg    SpO2 100%    BMI 50.75 kg/m   Physical Exam Constitutional:      General: She is not in acute distress.    Appearance: Normal appearance.  HENT:     Head: Normocephalic.     Nose: Nose normal.  Eyes:     Extraocular Movements: Extraocular movements intact.  Cardiovascular:     Rate and Rhythm: Normal rate.  Pulmonary:     Effort: Pulmonary effort is normal.  Abdominal:     Tenderness: There is abdominal tenderness in the epigastric area.  Musculoskeletal:        General: Normal range of motion.     Cervical back: Normal range of motion.  Neurological:     General: No focal deficit present.     Mental Status: She is alert.  Mental status is at baseline.    ED Results / Procedures / Treatments   Labs (all labs ordered are listed, but only abnormal results are displayed) Labs Reviewed  COMPREHENSIVE METABOLIC PANEL - Abnormal; Notable for the following components:      Result Value   Glucose, Bld 230 (*)    AST 77 (*)    ALT 80 (*)    All other components within normal limits  LIPASE, BLOOD - Abnormal; Notable for the following components:   Lipase 112 (*)    All other components within normal limits  CBC WITH DIFFERENTIAL/PLATELET - Abnormal; Notable for the following components:   RBC 5.46 (*)    MCV 79.3 (*)    MCH 24.7 (*)    All other components within normal limits  URINALYSIS, ROUTINE W REFLEX MICROSCOPIC - Abnormal; Notable for the following components:   Specific Gravity, Urine 1.033 (*)    Ketones, ur TRACE (*)    Protein, ur 30 (*)    Leukocytes,Ua SMALL (*)    All other components within normal limits  PREGNANCY, URINE    EKG None  Radiology CT ABDOMEN PELVIS W CONTRAST  Result Date: 12/05/2021 CLINICAL DATA:  Nausea, diarrhea and sharp abdominal pain x1 week. EXAM: CT ABDOMEN AND PELVIS WITH CONTRAST TECHNIQUE: Multidetector CT imaging of the abdomen and pelvis was performed using the standard protocol following bolus administration of intravenous contrast. CONTRAST:  12/07/2021 OMNIPAQUE IOHEXOL 300 MG/ML  SOLN COMPARISON:  April 06, 2020 FINDINGS: Lower chest: No acute abnormality. Hepatobiliary: There is diffuse fatty infiltration of the liver parenchyma. No focal liver abnormality is seen. Status post cholecystectomy. No biliary dilatation. Pancreas: Unremarkable. No pancreatic ductal dilatation or surrounding inflammatory changes. Spleen: Normal in size without focal abnormality. Adrenals/Urinary Tract: Adrenal glands are unremarkable. Kidneys are normal, without renal calculi, focal lesion, or hydronephrosis. Bladder is unremarkable. Stomach/Bowel: Stomach is within normal limits.  Appendix appears normal. No evidence of bowel wall thickening, distention, or inflammatory changes. Vascular/Lymphatic: No significant vascular findings are present. No enlarged abdominal or pelvic lymph nodes. Reproductive: Predominant stable diffuse endometrial thickening is suspected. The uterus is otherwise unremarkable. Bilateral adnexa are within normal limits. Other: No abdominal wall hernia or abnormality. No abdominopelvic ascites. Musculoskeletal: No acute or significant osseous findings. IMPRESSION: 1. No acute intra-abdominal findings. 2. Diffuse fatty infiltration of the liver. 3. Stable, suspected diffuse endometrial thickening. Correlation with pelvic ultrasound is recommended. Electronically Signed   By: April 08, 2020 M.D.   On: 12/05/2021 02:44    Procedures Procedures   Medications Ordered in ED  Medications  acetaminophen (TYLENOL) tablet 650 mg (650 mg Oral Given 12/04/21 2350)  sodium chloride 0.9 % bolus 1,000 mL (0 mLs Intravenous Stopped 12/05/21 0226)  iohexol (OMNIPAQUE) 300 MG/ML solution 100 mL (100 mLs Intravenous Contrast Given 12/05/21 0209)    ED Course  I have reviewed the triage vital signs and the nursing notes.  Pertinent labs & imaging results that were available during my care of the patient were reviewed by me and considered in my medical decision making (see chart for details).    MDM Rules/Calculators/A&P                         Labs are sent.  Lipase elevated 112 white count otherwise normal chemistry unremarkable.  CT Abdo pelvis pursued with no other emergent findings noted.  Demetrio thickening noted patient advised outpatient follow-up with her doctor within the week regarding these findings.  I have recommended liquid diet at home and continue Tylenol as needed for pain.  Advise follow-up with her doctor within the week as mentioned.  Advised immediate return for inability to keep down any fluids, worsening pain fevers or any additional  concerns.     Final Clinical Impression(s) / ED Diagnoses Final diagnoses:  Diarrhea, unspecified type  Acute pancreatitis, unspecified complication status, unspecified pancreatitis type    Rx / DC Orders ED Discharge Orders     None        Cheryll Cockayne, MD 12/05/21 [phone]

## 2022-01-04 ENCOUNTER — Encounter (HOSPITAL_COMMUNITY): Payer: Self-pay | Admitting: Radiology

## 2022-04-04 ENCOUNTER — Encounter (HOSPITAL_BASED_OUTPATIENT_CLINIC_OR_DEPARTMENT_OTHER): Payer: Self-pay | Admitting: Emergency Medicine

## 2022-04-04 ENCOUNTER — Emergency Department (HOSPITAL_BASED_OUTPATIENT_CLINIC_OR_DEPARTMENT_OTHER)
Admission: EM | Admit: 2022-04-04 | Discharge: 2022-04-05 | Disposition: A | Discharge status: Home / Self Care | Payer: Managed Care, Other (non HMO) | Attending: Emergency Medicine | Admitting: Emergency Medicine

## 2022-04-04 ENCOUNTER — Other Ambulatory Visit: Payer: Self-pay

## 2022-04-04 DIAGNOSIS — H9203 Otalgia, bilateral: Secondary | ICD-10-CM | POA: Diagnosis present

## 2022-04-04 DIAGNOSIS — Z5321 Procedure and treatment not carried out due to patient leaving prior to being seen by health care provider: Secondary | ICD-10-CM | POA: Diagnosis not present

## 2022-04-04 NOTE — ED Triage Notes (Signed)
Pt reports bilateral ear pain x 4 weeks. Pt reports at times her hearing sounds muffled.  ?

## 2022-04-13 ENCOUNTER — Emergency Department (HOSPITAL_BASED_OUTPATIENT_CLINIC_OR_DEPARTMENT_OTHER)
Admission: EM | Admit: 2022-04-13 | Discharge: 2022-04-13 | Disposition: A | Discharge status: Home / Self Care | Payer: Commercial Managed Care - HMO | Attending: Emergency Medicine | Admitting: Emergency Medicine

## 2022-04-13 ENCOUNTER — Encounter (HOSPITAL_BASED_OUTPATIENT_CLINIC_OR_DEPARTMENT_OTHER): Payer: Self-pay | Admitting: Urology

## 2022-04-13 DIAGNOSIS — Z7984 Long term (current) use of oral hypoglycemic drugs: Secondary | ICD-10-CM | POA: Insufficient documentation

## 2022-04-13 DIAGNOSIS — Z79899 Other long term (current) drug therapy: Secondary | ICD-10-CM | POA: Diagnosis not present

## 2022-04-13 DIAGNOSIS — E119 Type 2 diabetes mellitus without complications: Secondary | ICD-10-CM | POA: Insufficient documentation

## 2022-04-13 DIAGNOSIS — H9203 Otalgia, bilateral: Secondary | ICD-10-CM | POA: Diagnosis present

## 2022-04-13 NOTE — Discharge Instructions (Addendum)
Follow back up with your doctor or urgent care.  Would recommend Tylenol for the ear discomfort.  Suspect that the congestion will clear over the next few days. ?

## 2022-04-13 NOTE — ED Triage Notes (Signed)
Pt states bilateral ear pain x 3-4 weeks but right ear had swelling since last night, states decreased hearing ?Denies any drainage  ? ?

## 2022-04-13 NOTE — ED Provider Notes (Signed)
?[facility] EMERGENCY DEPARTMENT ?Provider Note ? ? ?CSN: [id number] ?Arrival date & time: 04/13/22  1605 ? ?  ? ?History ? ?Chief Complaint  ?Patient presents with  ? Ear Fullness  ? ? ?Kelsey Lucas is a 31 y.o. female. ? ?Patient with a complaint of bilateral ear pain right greater than left for about 3 weeks.  Patient was on antibiotics for sinus infection which she just finished on Friday.  Patient states she still has a fair amount of congestion.  Has some popping and crackling in both ears.  And seems to have some decreased hearing.  No outward drainage ? ?Past medical history sniffing for diabetes polycystic ovarian syndrome and patient had her gallbladder removed. ? ? ?  ? ?Home Medications ?Prior to Admission medications   ?Medication Sig Start Date End Date Taking? Authorizing Provider  ?diclofenac Sodium (VOLTAREN) 1 % GEL Apply 2 g topically 4 (four) times daily as needed. 09/08/21   Long, Arlyss Repress, MD  ?ergocalciferol (VITAMIN D2) 1.25 MG (50000 UT) capsule Take 50,000 Units by mouth once a week. Friday    [provider]  ?medroxyPROGESTERone (PROVERA) 10 MG tablet Take 10 mg by mouth daily.    [provider]  ?metFORMIN (GLUCOPHAGE) 500 MG tablet Take 500 mg by mouth 2 (two) times daily with a meal.    [provider]  ?norgestrel-ethinyl estradiol (OGESTROL) 0.5-50 MG-MCG tablet Take 1 tablet by mouth daily.    [provider]  ?ondansetron (ZOFRAN ODT) 4 MG disintegrating tablet 4mg  ODT q4 hours prn nausea/vomit ?Patient taking differently: Take 4 mg by mouth every 8 (eight) hours as needed for nausea or vomiting.  03/23/20   05/23/20, PA-C  ?oxyCODONE (OXY IR/ROXICODONE) 5 MG immediate release tablet Take 1 tablet (5 mg total) by mouth every 6 (six) hours as needed for severe pain. 05/21/20   Cornett, 07/21/20, MD  ?predniSONE (DELTASONE) 5 MG tablet Take 6 pills for first day, 5 pills second day, 4 pills third day, 3 pills fourth day, 2 pills the  fifth day, and 1 pill sixth day. 09/08/21   09/10/21, MD  ?   ? ?Allergies    ?Ibuprofen   ? ?Review of Systems   ?Review of Systems  ?Constitutional:  Negative for chills and fever.  ?HENT:  Positive for congestion, ear pain and hearing loss. Negative for sore throat.   ?Eyes:  Negative for pain and visual disturbance.  ?Respiratory:  Negative for cough and shortness of breath.   ?Cardiovascular:  Negative for chest pain and palpitations.  ?Gastrointestinal:  Negative for abdominal pain and vomiting.  ?Genitourinary:  Negative for dysuria and hematuria.  ?Musculoskeletal:  Negative for arthralgias and back pain.  ?Skin:  Negative for color change and rash.  ?Neurological:  Negative for seizures and syncope.  ?All other systems reviewed and are negative. ? ?Physical Exam ?Updated Vital Signs ?BP (!) 186/97 (BP Location: Right Arm)   Pulse 98   Temp 98.7 ?F (37.1 ?C)   Resp 18   Ht 1.651 m (5\' 5" )   Wt (!) 142.9 kg   SpO2 97%   BMI 52.42 kg/m?  ?Physical Exam ?Vitals and nursing note reviewed.  ?Constitutional:   ?   General: She is not in acute distress. ?   Appearance: Normal appearance. She is well-developed. She is not ill-appearing.  ?HENT:  ?   Head: Normocephalic and atraumatic.  ?   Right Ear: Tympanic membrane, ear canal and external  ear normal.  ?   Left Ear: Tympanic membrane, ear canal and external ear normal.  ?   Ears:  ?   Comments: Some soft wax in both ears.  Not impacted.  Portions of the tympanic membranes are visible.  No evidence of any infection or bulging.  External ear canals are normal ?   Mouth/Throat:  ?   Pharynx: Oropharynx is clear.  ?Eyes:  ?   Extraocular Movements: Extraocular movements intact.  ?   Conjunctiva/sclera: Conjunctivae normal.  ?   Pupils: Pupils are equal, round, and reactive to light.  ?Cardiovascular:  ?   Rate and Rhythm: Normal rate and regular rhythm.  ?   Heart sounds: No murmur heard. ?Pulmonary:  ?   Effort: Pulmonary effort is normal. No  respiratory distress.  ?   Breath sounds: Normal breath sounds.  ?Abdominal:  ?   Palpations: Abdomen is soft.  ?   Tenderness: There is no abdominal tenderness.  ?Musculoskeletal:     ?   General: No swelling.  ?   Cervical back: Neck supple. No rigidity.  ?Skin: ?   General: Skin is warm and dry.  ?   Capillary Refill: Capillary refill takes less than 2 seconds.  ?Neurological:  ?   General: No focal deficit present.  ?   Mental Status: She is alert and oriented to person, place, and time.  ?   Cranial Nerves: No cranial nerve deficit.  ?   Sensory: No sensory deficit.  ?   Motor: No weakness.  ?Psychiatric:     ?   Mood and Affect: Mood normal.  ? ? ?ED Results / Procedures / Treatments   ?Labs ?(all labs ordered are listed, but only abnormal results are displayed) ?Labs Reviewed - No data to display ? ?EKG ?None ? ?Radiology ?No results found. ? ?Procedures ?Procedures  ? ? ?Medications Ordered in ED ?Medications - No data to display ? ?ED Course/ Medical Decision Making/ A&P ?  ?                        ?Medical Decision Making ? ?Patient's ear exam without evidence of otitis externa or otitis media.  Feel that patient probably has a little bit eustachian tube dysfunction.  Probably has a little bit of fluid in that area.  Patient just completed a course of antibiotics on Friday.  No indication for restarting antibiotics. ? ? ? ? ?Final Clinical Impression(s) / ED Diagnoses ?Final diagnoses:  ?Ear pain, bilateral  ? ? ?Rx / DC Orders ?ED Discharge Orders   ? ? None  ? ?  ? ? ?  ?Vanetta Mulders, MD ?04/13/22 1643 ? ?

## 2023-04-03 ENCOUNTER — Encounter: Payer: Self-pay | Admitting: *Deleted

## 2023-07-04 ENCOUNTER — Encounter (HOSPITAL_COMMUNITY): Payer: Self-pay

## 2023-07-04 ENCOUNTER — Other Ambulatory Visit: Payer: Self-pay

## 2023-07-04 ENCOUNTER — Emergency Department (HOSPITAL_COMMUNITY)
Admission: EM | Admit: 2023-07-04 | Discharge: 2023-07-04 | Disposition: A | Discharge status: Home / Self Care | Payer: Self-pay | Attending: Emergency Medicine | Admitting: Emergency Medicine

## 2023-07-04 ENCOUNTER — Emergency Department (HOSPITAL_COMMUNITY): Payer: Self-pay

## 2023-07-04 DIAGNOSIS — Z7984 Long term (current) use of oral hypoglycemic drugs: Secondary | ICD-10-CM | POA: Insufficient documentation

## 2023-07-04 DIAGNOSIS — R109 Unspecified abdominal pain: Secondary | ICD-10-CM | POA: Insufficient documentation

## 2023-07-04 DIAGNOSIS — R197 Diarrhea, unspecified: Secondary | ICD-10-CM | POA: Insufficient documentation

## 2023-07-04 LAB — CBC
HCT: 43.9 % (ref 36.0–46.0)
Hemoglobin: 13.6 g/dL (ref 12.0–15.0)
MCH: 25.1 pg — ABNORMAL LOW (ref 26.0–34.0)
MCHC: 31 g/dL (ref 30.0–36.0)
MCV: 81 fL (ref 80.0–100.0)
Platelets: 293 10*3/uL (ref 150–400)
RBC: 5.42 MIL/uL — ABNORMAL HIGH (ref 3.87–5.11)
RDW: 14 % (ref 11.5–15.5)
WBC: 6.7 10*3/uL (ref 4.0–10.5)
nRBC: 0 % (ref 0.0–0.2)

## 2023-07-04 LAB — COMPREHENSIVE METABOLIC PANEL
ALT: 110 U/L — ABNORMAL HIGH (ref 0–44)
AST: 131 U/L — ABNORMAL HIGH (ref 15–41)
Albumin: 3.8 g/dL (ref 3.5–5.0)
Alkaline Phosphatase: 83 U/L (ref 38–126)
Anion gap: 10 (ref 5–15)
BUN: 7 mg/dL (ref 6–20)
CO2: 27 mmol/L (ref 22–32)
Calcium: 8.9 mg/dL (ref 8.9–10.3)
Chloride: 99 mmol/L (ref 98–111)
Creatinine, Ser: 0.58 mg/dL (ref 0.44–1.00)
GFR, Estimated: >60 mL/min (ref >60)
Glucose, Bld: 189 mg/dL — ABNORMAL HIGH (ref 70–99)
Potassium: 3.4 mmol/L — ABNORMAL LOW (ref 3.5–5.1)
Sodium: 136 mmol/L (ref 135–145)
Total Bilirubin: 1 mg/dL (ref 0.3–1.2)
Total Protein: 8.1 g/dL (ref 6.5–8.1)

## 2023-07-04 LAB — URINALYSIS, ROUTINE W REFLEX MICROSCOPIC
Bilirubin Urine: NEGATIVE
Glucose, UA: NEGATIVE mg/dL
Hgb urine dipstick: NEGATIVE
Ketones, ur: NEGATIVE mg/dL
Nitrite: NEGATIVE
Protein, ur: 30 mg/dL — AB
Specific Gravity, Urine: 1.024 (ref 1.005–1.030)
pH: 5 (ref 5.0–8.0)

## 2023-07-04 LAB — HCG, SERUM, QUALITATIVE: Preg, Serum: NEGATIVE

## 2023-07-04 LAB — LIPASE, BLOOD: Lipase: 29 U/L (ref 11–51)

## 2023-07-04 MED ORDER — ONDANSETRON HCL 4 MG/2ML IJ SOLN
4.0000 mg | Freq: Once | INTRAMUSCULAR | Status: DC
  Filled 2023-07-04: qty 2

## 2023-07-04 MED ORDER — CIPROFLOXACIN HCL 500 MG PO TABS: 500.0000 mg | ORAL_TABLET | Freq: Two times a day (BID) | ORAL | 0 refills | Status: AC

## 2023-07-04 MED ORDER — ONDANSETRON HCL 4 MG PO TABS
4.0000 mg | ORAL_TABLET | Freq: Four times a day (QID) | ORAL | 0 refills | Status: AC
Start: 2023-07-04 — End: ?

## 2023-07-04 MED ORDER — SODIUM CHLORIDE 0.9 % IV BOLUS: 1000.0000 mL | Freq: Once | INTRAVENOUS | Status: DC

## 2023-07-04 NOTE — ED Provider Notes (Signed)
[place] [facility] AT [facility] Provider Note   CSN: [id number] Arrival date & time: 07/04/23  1314     History  Chief Complaint  Patient presents with   Abdominal Pain    Kelsey Lucas is a 32 y.o. female.  32 year old female with prior medical history as detailed below presents for evaluation.  Patient complains of crampy abdominal pain for 1 week.  She reports loose diarrheal stool associated with same.  She denies vomiting.  She does report some intermittent nausea.  This afternoon she had a bowel movement that was again loose and diarrheal.  She thought she saw small streaks of bright red blood in her stool.  She denies fever.  She denies recent travel or ingestion of untreated water or unsafe food.  The history is provided by the patient and medical records.       Home Medications Prior to Admission medications   Medication Sig Start Date End Date Taking? Authorizing Provider  diclofenac Sodium (VOLTAREN) 1 % GEL Apply 2 g topically 4 (four) times daily as needed. 09/08/21   Long, Arlyss Repress, MD  ergocalciferol (VITAMIN D2) 1.25 MG (50000 UT) capsule Take 50,000 Units by mouth once a week. Friday    [provider]  medroxyPROGESTERone (PROVERA) 10 MG tablet Take 10 mg by mouth daily.    [provider]  metFORMIN (GLUCOPHAGE) 500 MG tablet Take 500 mg by mouth 2 (two) times daily with a meal.    [provider]  norgestrel-ethinyl estradiol (OGESTROL) 0.5-50 MG-MCG tablet Take 1 tablet by mouth daily.    [provider]  ondansetron (ZOFRAN ODT) 4 MG disintegrating tablet 4mg  ODT q4 hours prn nausea/vomit Patient taking differently: Take 4 mg by mouth every 8 (eight) hours as needed for nausea or vomiting.  03/23/20   Dartha Lodge, PA-C  oxyCODONE (OXY IR/ROXICODONE) 5 MG immediate release tablet Take 1 tablet (5 mg total) by mouth every 6 (six) hours as needed for severe pain. 05/21/20   Cornett, Maisie Fus, MD   predniSONE (DELTASONE) 5 MG tablet Take 6 pills for first day, 5 pills second day, 4 pills third day, 3 pills fourth day, 2 pills the fifth day, and 1 pill sixth day. 09/08/21   Myra Rude, MD      Allergies    Ibuprofen    Review of Systems   Review of Systems  All other systems reviewed and are negative.   Physical Exam Updated Vital Signs BP (!) 159/107 (BP Location: Left Arm)   Pulse 98   Temp 97.9 F (36.6 C) (Oral)   Resp 17   Ht 5\' 5"  (1.651 m)   Wt (!) 142.9 kg   SpO2 99%   BMI 52.42 kg/m  Physical Exam Vitals and nursing note reviewed.  Constitutional:      General: She is not in acute distress.    Appearance: Normal appearance. She is well-developed.  HENT:     Head: Normocephalic and atraumatic.  Eyes:     Conjunctiva/sclera: Conjunctivae normal.     Pupils: Pupils are equal, round, and reactive to light.  Cardiovascular:     Rate and Rhythm: Normal rate and regular rhythm.     Heart sounds: Normal heart sounds.  Pulmonary:     Effort: Pulmonary effort is normal. No respiratory distress.     Breath sounds: Normal breath sounds.  Abdominal:     General: There is no distension.     Palpations: Abdomen is  soft.     Tenderness: There is generalized abdominal tenderness.     Comments: Mild diffuse abdominal tenderness with deep palpation  Musculoskeletal:        General: No deformity. Normal range of motion.     Cervical back: Normal range of motion and neck supple.  Skin:    General: Skin is warm and dry.  Neurological:     General: No focal deficit present.     Mental Status: She is alert and oriented to person, place, and time.     ED Results / Procedures / Treatments   Labs (all labs ordered are listed, but only abnormal results are displayed) Labs Reviewed  COMPREHENSIVE METABOLIC PANEL - Abnormal; Notable for the following components:      Result Value   Potassium 3.4 (*)    Glucose, Bld 189 (*)    AST 131 (*)    ALT 110 (*)    All  other components within normal limits  CBC - Abnormal; Notable for the following components:   RBC 5.42 (*)    MCH 25.1 (*)    All other components within normal limits  LIPASE, BLOOD  HCG, SERUM, QUALITATIVE  URINALYSIS, ROUTINE W REFLEX MICROSCOPIC    EKG None  Radiology No results found.  Procedures Procedures    Medications Ordered in ED Medications  sodium chloride 0.9 % bolus 1,000 mL (has no administration in time range)    ED Course/ Medical Decision Making/ A&P                             Medical Decision Making Amount and/or Complexity of Data Reviewed Radiology: ordered.  Risk Prescription drug management.    Medical Screen Complete  This patient presented to the ED with complaint of diarrhea.  This complaint involves an extensive number of treatment options. The initial differential diagnosis includes, but is not limited to, viral versus bacterial infection, metabolic abnormality, anemia, etc.  This presentation is: Acute, Self-Limited, Previously Undiagnosed, Uncertain Prognosis, Complicated, Systemic Symptoms, and Threat to Life/Bodily Function  Patient is presenting with complaint of diffuse abdominal crampy pain, diarrhea with onset of symptoms 1 week ago.  Patient reports seeing some small amounts of bright red blood and diarrheal bowel movement earlier today.    Screening labs did not reveal significant abnormality.  Patient refused IV so CT abdomen pelvis without contrast was obtained.  CT imaging is without acute abnormality.  Patient would possibly benefit from course of antibiotics to treat bloody diarrhea.  Patient understands need for close outpatient follow-up.  Strict return precautions given and understood. Additional history obtained: External records from outside sources obtained and reviewed including prior ED visits and prior Inpatient records.    Lab Tests:  I ordered and personally interpreted labs.  Imaging Studies  ordered:  I ordered imaging studies including CT abdomen pelvis I independently visualized and interpreted obtained imaging which showed NAD I agree with the radiologist interpretation.   Cardiac Monitoring:  The patient was maintained on a cardiac monitor.  I personally viewed and interpreted the cardiac monitor which showed an underlying rhythm of: NSR  Problem List / ED Course:  Diarrhea   Reevaluation:  After the interventions noted above, I reevaluated the patient and found that they have: improved   Disposition:  After consideration of the diagnostic results and the patients response to treatment, I feel that the patent would benefit from close outpatient follow-up.  Final Clinical Impression(s) / ED Diagnoses Final diagnoses:  Bloody diarrhea    Rx / DC Orders ED Discharge Orders          Ordered    ciprofloxacin (CIPRO) 500 MG tablet  Every 12 hours        07/04/23 2034    ondansetron (ZOFRAN) 4 MG tablet  Every 6 hours        07/04/23 2034              Wynetta Fines, MD 07/04/23 2127

## 2023-07-04 NOTE — ED Triage Notes (Signed)
Pt reports generalized abd pain x 1 week, "red/orangy stools" since yesterday

## 2023-07-04 NOTE — Discharge Instructions (Addendum)
Return for any problem.  ?

## 2024-08-21 ENCOUNTER — Emergency Department (HOSPITAL_COMMUNITY): Payer: Self-pay

## 2024-08-21 ENCOUNTER — Other Ambulatory Visit: Payer: Self-pay

## 2024-08-21 ENCOUNTER — Emergency Department (HOSPITAL_COMMUNITY)
Admission: EM | Admit: 2024-08-21 | Discharge: 2024-08-21 | Disposition: A | Discharge status: Home / Self Care | Payer: Self-pay | Attending: Emergency Medicine | Admitting: Emergency Medicine

## 2024-08-21 ENCOUNTER — Encounter (HOSPITAL_COMMUNITY): Payer: Self-pay

## 2024-08-21 DIAGNOSIS — R0789 Other chest pain: Secondary | ICD-10-CM | POA: Insufficient documentation

## 2024-08-21 DIAGNOSIS — Z7984 Long term (current) use of oral hypoglycemic drugs: Secondary | ICD-10-CM | POA: Insufficient documentation

## 2024-08-21 DIAGNOSIS — I10 Essential (primary) hypertension: Secondary | ICD-10-CM | POA: Insufficient documentation

## 2024-08-21 DIAGNOSIS — R030 Elevated blood-pressure reading, without diagnosis of hypertension: Secondary | ICD-10-CM

## 2024-08-21 DIAGNOSIS — R0602 Shortness of breath: Secondary | ICD-10-CM | POA: Insufficient documentation

## 2024-08-21 DIAGNOSIS — J029 Acute pharyngitis, unspecified: Secondary | ICD-10-CM | POA: Insufficient documentation

## 2024-08-21 LAB — BASIC METABOLIC PANEL WITH GFR
Anion gap: 12 (ref 5–15)
BUN: 8 mg/dL (ref 6–20)
CO2: 26 mmol/L (ref 22–32)
Calcium: 9.5 mg/dL (ref 8.9–10.3)
Chloride: 98 mmol/L (ref 98–111)
Creatinine, Ser: 0.55 mg/dL (ref 0.44–1.00)
GFR, Estimated: >60 mL/min (ref >60)
Glucose, Bld: 255 mg/dL — ABNORMAL HIGH (ref 70–99)
Potassium: 4.2 mmol/L (ref 3.5–5.1)
Sodium: 137 mmol/L (ref 135–145)

## 2024-08-21 LAB — CBC
HCT: 43.6 % (ref 36.0–46.0)
Hemoglobin: 13.6 g/dL (ref 12.0–15.0)
MCH: 25.4 pg — ABNORMAL LOW (ref 26.0–34.0)
MCHC: 31.2 g/dL (ref 30.0–36.0)
MCV: 81.5 fL (ref 80.0–100.0)
Platelets: 313 K/uL (ref 150–400)
RBC: 5.35 MIL/uL — ABNORMAL HIGH (ref 3.87–5.11)
RDW: 14.4 % (ref 11.5–15.5)
WBC: 7.3 K/uL (ref 4.0–10.5)
nRBC: 0 % (ref 0.0–0.2)

## 2024-08-21 LAB — D-DIMER, QUANTITATIVE: D-Dimer, Quant: 0.38 ug{FEU}/mL (ref 0.00–0.50)

## 2024-08-21 LAB — RESP PANEL BY RT-PCR (RSV, FLU A&B, COVID)  RVPGX2
Influenza A by PCR: NEGATIVE
Influenza B by PCR: NEGATIVE
Resp Syncytial Virus by PCR: NEGATIVE
SARS Coronavirus 2 by RT PCR: NEGATIVE

## 2024-08-21 LAB — GROUP A STREP BY PCR: Group A Strep by PCR: NOT DETECTED

## 2024-08-21 LAB — HCG, SERUM, QUALITATIVE: Preg, Serum: NEGATIVE

## 2024-08-21 LAB — TROPONIN T, HIGH SENSITIVITY: Troponin T High Sensitivity: <15 ng/L (ref 0–19)

## 2024-08-21 MED ORDER — ACETAMINOPHEN 500 MG PO TABS
1000.0000 mg | ORAL_TABLET | Freq: Once | ORAL | Status: AC
  Administered 2024-08-21: 1000 mg via ORAL
  Filled 2024-08-21: qty 2

## 2024-08-21 MED ORDER — ONDANSETRON 4 MG PO TBDP
4.0000 mg | ORAL_TABLET | Freq: Once | ORAL | Status: AC
  Administered 2024-08-21: 4 mg via ORAL
  Filled 2024-08-21: qty 1

## 2024-08-21 NOTE — ED Triage Notes (Signed)
 Chest pain that started about 5 days ago and a sore throat that start yesterday. 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 States her chest feels like a strain or a pull.

## 2024-08-21 NOTE — Discharge Instructions (Signed)
 Your blood pressure is elevated today, you need to follow-up closely with your primary care provider to get your blood pressure rechecked.  You may take Tylenol  to help with your pain.  If you develop new or worsening chest pain, shortness of breath, or any other new/concerning symptoms then return to the ER.

## 2024-08-21 NOTE — ED Notes (Signed)
 Urine was sent down for this patient

## 2024-08-21 NOTE — ED Provider Notes (Signed)
 [place] EMERGENCY DEPARTMENT AT [facility] Provider Note   CSN: [id number] Arrival date & time: 08/21/24  1010     Patient presents with: Chest Pain and Sore Throat   Kelsey Lucas is a 33 y.o. female.   HPI 33 year old female presents with chest pain and sore throat.  She has been having multiple days of sharp chest pain in the middle of her chest.  It does not radiate.  It is there all the time but gets worse with deep inspiration.  She also feels short of breath.  This worsens when she walks.  Today she is start developing a little bit of sore throat/scratchiness and cough and nausea without vomiting.  No abdominal pain.  No fevers or leg swelling.  No recent traveling or surgery or history of DVT.  She denies any significant past medical history. She has not taken anything for the pain.  Prior to Admission medications   Medication Sig Start Date End Date Taking? Authorizing Provider  ciprofloxacin  (CIPRO ) 500 MG tablet Take 1 tablet (500 mg total) by mouth every 12 (twelve) hours. 07/04/23   Laurice Maude BROCKS, MD  diclofenac  Sodium (VOLTAREN ) 1 % GEL Apply 2 g topically 4 (four) times daily as needed. 09/08/21   Long, Joshua G, MD  ergocalciferol (VITAMIN D2) 1.25 MG (50000 UT) capsule Take 50,000 Units by mouth once a week. Friday    [provider]  medroxyPROGESTERone (PROVERA) 10 MG tablet Take 10 mg by mouth daily.    [provider]  metFORMIN (GLUCOPHAGE) 500 MG tablet Take 500 mg by mouth 2 (two) times daily with a meal.    [provider]  norgestrel-ethinyl estradiol (OGESTROL) 0.5-50 MG-MCG tablet Take 1 tablet by mouth daily.    [provider]  ondansetron  (ZOFRAN  ODT) 4 MG disintegrating tablet 4mg  ODT q4 hours prn nausea/vomit Patient taking differently: Take 4 mg by mouth every 8 (eight) hours as needed for nausea or vomiting.  03/23/20   Alva Larraine FALCON, PA-C  ondansetron  (ZOFRAN ) 4 MG tablet Take 1 tablet (4 mg total)  by mouth every 6 (six) hours. 07/04/23   Laurice Maude BROCKS, MD  oxyCODONE  (OXY IR/ROXICODONE ) 5 MG immediate release tablet Take 1 tablet (5 mg total) by mouth every 6 (six) hours as needed for severe pain. 05/21/20   Cornett, Debby, MD  predniSONE  (DELTASONE ) 5 MG tablet Take 6 pills for first day, 5 pills second day, 4 pills third day, 3 pills fourth day, 2 pills the fifth day, and 1 pill sixth day. 09/08/21   Chick Venetia BRAVO, MD    Allergies: Ibuprofen    Review of Systems  Constitutional:  Negative for fever.  HENT:  Positive for sore throat.   Respiratory:  Positive for cough and shortness of breath.   Cardiovascular:  Positive for chest pain. Negative for leg swelling.  Gastrointestinal:  Positive for nausea. Negative for abdominal pain and vomiting.    Updated Vital Signs BP (!) 168/99 (BP Location: Left Arm)   Pulse 87   Temp 98.4 F (36.9 C) (Oral)   Resp (!) 23   Ht 5' 3 (1.6 m)   Wt 136.1 kg   SpO2 99%   BMI 53.14 kg/m   Physical Exam Vitals and nursing note reviewed.  Constitutional:      Appearance: She is well-developed.  HENT:     Head: Normocephalic and atraumatic.     Mouth/Throat:     Mouth: No oral lesions.  Pharynx: Oropharynx is clear. Uvula midline. No pharyngeal swelling, oropharyngeal exudate or uvula swelling.     Tonsils: No tonsillar exudate or tonsillar abscesses.  Cardiovascular:     Rate and Rhythm: Normal rate and regular rhythm.     Heart sounds: Normal heart sounds.  Pulmonary:     Effort: Pulmonary effort is normal.     Breath sounds: Normal breath sounds.  Abdominal:     Palpations: Abdomen is soft.     Tenderness: There is no abdominal tenderness.  Musculoskeletal:     Right lower leg: No edema.     Left lower leg: No edema.  Skin:    General: Skin is warm and dry.  Neurological:     Mental Status: She is alert.     (all labs ordered are listed, but only abnormal results are displayed) Labs Reviewed  BASIC METABOLIC PANEL  WITH GFR - Abnormal; Notable for the following components:      Result Value   Glucose, Bld 255 (*)    All other components within normal limits  CBC - Abnormal; Notable for the following components:   RBC 5.35 (*)    MCH 25.4 (*)    All other components within normal limits  RESP PANEL BY RT-PCR (RSV, FLU A&B, COVID)  [id number]  GROUP A STREP BY PCR  HCG, SERUM, QUALITATIVE  D-DIMER, QUANTITATIVE  TROPONIN T, HIGH SENSITIVITY    EKG: EKG Interpretation Date/Time:  Wednesday August 21 2024 12:29:06 EDT Ventricular Rate:  91 PR Interval:  130 QRS Duration:  71 QT Interval:  415 QTC Calculation: 511 R Axis:   50  Text Interpretation: Sinus rhythm Borderline T abnormalities, lateral leads no significant change since earlier in the day or 2022 Confirmed by Freddi Hamilton [phone]) on 08/21/2024 12:31:29 PM  Radiology: [facility] Chest 2 View Result Date: 08/21/2024 CLINICAL DATA:  Chest pain. EXAM: CHEST - 2 VIEW COMPARISON:  12/30/2019. FINDINGS: Lungs are somewhat low in volume. Trachea is midline. Heart size normal. Lungs are clear. No fluid. IMPRESSION: No acute findings. Electronically Signed   By: Newell Eke M.D.   On: 08/21/2024 10:55     Procedures   Medications Ordered in the ED  acetaminophen  (TYLENOL ) tablet 1,000 mg (1,000 mg Oral Given 08/21/24 1303)  ondansetron  (ZOFRAN -ODT) disintegrating tablet 4 mg (4 mg Oral Given 08/21/24 1303)                                    Medical Decision Making Amount and/or Complexity of Data Reviewed Labs: ordered.    Details: Normal troponin.  Normal D-dimer Radiology: ordered and independent interpretation performed.    Details: No pneumonia ECG/medicine tests: ordered and independent interpretation performed.    Details: No ischemia  Risk OTC drugs. Prescription drug management.   Patient chest pain is likely chest wall in etiology.  There is pain with certain movements such as picking up kids.  However there is also a  pleuritic component.  D-dimer was sent for otherwise low risk PE and is negative.  Troponin is negative after multiple days of symptoms, doubt ACS.  Chest x-ray is unremarkable.  She did have some mild sore throat but a strep and COVID test are negative.  Will recommend Tylenol  given her NSAID allergy.  Otherwise I think this is probably muscular I think she is stable for discharge.  It is noted that she was a little hyperglycemic and  I discussed she will need to follow this up with the PCP for further testing for possible diabetes.  She is hypertensive here as well and while it did go up to 168/99, most recently on my exam it was in the 150s.  Discussed she needs to follow-up with PCP and consider antihypertensive therapy though she does not want to start on blood pressure medicine currently.  Will discharge with plan to follow-up with PCP and will give return precautions.     Final diagnoses:  Chest wall pain  Elevated blood pressure reading    ED Discharge Orders     None          Freddi Hamilton, MD 08/21/24 [phone]
